# Patient Record
Sex: Female | Born: 1960
Health system: Southern US, Community
[De-identification: ages and names within clinical notes are randomized; demographics above are authoritative.]

## PROBLEM LIST (undated history)

## (undated) DIAGNOSIS — M8580 Other specified disorders of bone density and structure, unspecified site: Secondary | ICD-10-CM

## (undated) DIAGNOSIS — Z5189 Encounter for other specified aftercare: Secondary | ICD-10-CM

## (undated) DIAGNOSIS — E079 Disorder of thyroid, unspecified: Secondary | ICD-10-CM

## (undated) DIAGNOSIS — K219 Gastro-esophageal reflux disease without esophagitis: Secondary | ICD-10-CM

## (undated) HISTORY — PX: ABDOMINAL HYSTERECTOMY: SHX81

## (undated) HISTORY — DX: Gastro-esophageal reflux disease without esophagitis: K21.9

## (undated) HISTORY — PX: COLONOSCOPY: SHX174

## (undated) HISTORY — DX: Encounter for other specified aftercare: Z51.89

## (undated) HISTORY — DX: Other specified disorders of bone density and structure, unspecified site: M85.80

## (undated) HISTORY — PX: BREAST EXCISIONAL BIOPSY: SUR124

---
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
## 1983-06-18 HISTORY — PX: OTHER SURGICAL HISTORY: SHX169

## 1985-06-17 HISTORY — PX: HIP PINNING: SHX1757

## 1987-06-18 HISTORY — PX: OTHER SURGICAL HISTORY: SHX169

## 1989-06-17 HISTORY — PX: CHOLECYSTECTOMY: SHX55

## 1994-06-17 HISTORY — PX: VAGINAL HYSTERECTOMY: SUR661

## 1997-06-17 HISTORY — PX: TUBAL LIGATION: SHX77

## 1997-06-17 HISTORY — PX: APPENDECTOMY: SHX54

## 1997-06-17 HISTORY — PX: OTHER SURGICAL HISTORY: SHX169

## 1998-03-22 ENCOUNTER — Other Ambulatory Visit: Admission: RE | Admit: 1998-03-22 | Discharge: 1998-03-22 | Payer: Self-pay | Admitting: Obstetrics and Gynecology

## 1998-03-25 ENCOUNTER — Observation Stay (HOSPITAL_COMMUNITY): Admission: EM | Admit: 1998-03-25 | Discharge: 1998-03-25 | Payer: Self-pay | Admitting: Emergency Medicine

## 1998-04-04 ENCOUNTER — Ambulatory Visit: Admission: RE | Admit: 1998-04-04 | Discharge: 1998-04-04 | Payer: Self-pay | Admitting: Gynecology

## 1998-04-05 ENCOUNTER — Inpatient Hospital Stay (HOSPITAL_COMMUNITY): Admission: RE | Admit: 1998-04-05 | Discharge: 1998-04-11 | Payer: Self-pay | Admitting: Obstetrics and Gynecology

## 1998-06-02 ENCOUNTER — Ambulatory Visit (HOSPITAL_COMMUNITY): Admission: RE | Admit: 1998-06-02 | Discharge: 1998-06-02 | Payer: Self-pay | Admitting: Surgery

## 1998-06-17 HISTORY — PX: INCISIONAL HERNIA REPAIR: SHX193

## 1999-03-26 ENCOUNTER — Ambulatory Visit (HOSPITAL_COMMUNITY): Admission: RE | Admit: 1999-03-26 | Discharge: 1999-03-26 | Payer: Self-pay | Admitting: Family Medicine

## 1999-03-26 ENCOUNTER — Encounter: Payer: Self-pay | Admitting: Family Medicine

## 1999-08-20 ENCOUNTER — Inpatient Hospital Stay (HOSPITAL_COMMUNITY): Admission: RE | Admit: 1999-08-20 | Discharge: 1999-08-25 | Payer: Self-pay | Admitting: Surgery

## 1999-08-22 ENCOUNTER — Encounter: Payer: Self-pay | Admitting: Surgery

## 1999-10-16 ENCOUNTER — Encounter: Admission: RE | Admit: 1999-10-16 | Discharge: 1999-10-16 | Payer: Self-pay | Admitting: Obstetrics and Gynecology

## 1999-10-16 ENCOUNTER — Encounter: Payer: Self-pay | Admitting: Obstetrics and Gynecology

## 2000-04-08 ENCOUNTER — Encounter: Payer: Self-pay | Admitting: Family Medicine

## 2000-04-08 ENCOUNTER — Encounter: Admission: RE | Admit: 2000-04-08 | Discharge: 2000-04-08 | Payer: Self-pay | Admitting: Family Medicine

## 2000-05-24 ENCOUNTER — Encounter: Payer: Self-pay | Admitting: Internal Medicine

## 2000-05-24 ENCOUNTER — Emergency Department (HOSPITAL_COMMUNITY): Admission: EM | Admit: 2000-05-24 | Discharge: 2000-05-24 | Payer: Self-pay | Admitting: Internal Medicine

## 2002-02-10 ENCOUNTER — Encounter: Payer: Self-pay | Admitting: Obstetrics and Gynecology

## 2002-02-10 ENCOUNTER — Encounter: Admission: RE | Admit: 2002-02-10 | Discharge: 2002-02-10 | Payer: Self-pay | Admitting: Obstetrics and Gynecology

## 2002-04-09 ENCOUNTER — Encounter: Admission: RE | Admit: 2002-04-09 | Discharge: 2002-04-09 | Payer: Self-pay | Admitting: Family Medicine

## 2002-04-09 ENCOUNTER — Encounter: Payer: Self-pay | Admitting: Family Medicine

## 2003-08-24 ENCOUNTER — Other Ambulatory Visit: Admission: RE | Admit: 2003-08-24 | Discharge: 2003-08-24 | Payer: Self-pay | Admitting: Family Medicine

## 2003-10-06 ENCOUNTER — Ambulatory Visit (HOSPITAL_COMMUNITY): Admission: RE | Admit: 2003-10-06 | Discharge: 2003-10-06 | Payer: Self-pay | Admitting: *Deleted

## 2004-08-15 ENCOUNTER — Encounter: Admission: RE | Admit: 2004-08-15 | Discharge: 2004-08-15 | Payer: Self-pay | Admitting: Family Medicine

## 2004-08-28 ENCOUNTER — Encounter: Admission: RE | Admit: 2004-08-28 | Discharge: 2004-08-28 | Payer: Self-pay | Admitting: Family Medicine

## 2005-04-15 ENCOUNTER — Encounter: Admission: RE | Admit: 2005-04-15 | Discharge: 2005-04-15 | Payer: Self-pay | Admitting: Family Medicine

## 2005-09-06 ENCOUNTER — Encounter: Admission: RE | Admit: 2005-09-06 | Discharge: 2005-09-06 | Payer: Self-pay | Admitting: Family Medicine

## 2005-10-16 ENCOUNTER — Encounter: Payer: Self-pay | Admitting: Obstetrics & Gynecology

## 2006-09-23 ENCOUNTER — Encounter: Admission: RE | Admit: 2006-09-23 | Discharge: 2006-09-23 | Payer: Self-pay | Admitting: Family Medicine

## 2008-10-20 ENCOUNTER — Encounter: Admission: RE | Admit: 2008-10-20 | Discharge: 2008-10-20 | Payer: Self-pay | Admitting: Family Medicine

## 2009-11-19 ENCOUNTER — Emergency Department (HOSPITAL_COMMUNITY): Admission: EM | Admit: 2009-11-19 | Discharge: 2009-11-19 | Payer: Self-pay | Admitting: Family Medicine

## 2010-02-01 ENCOUNTER — Encounter: Admission: RE | Admit: 2010-02-01 | Discharge: 2010-02-01 | Payer: Self-pay | Admitting: Nurse Practitioner

## 2010-02-08 ENCOUNTER — Emergency Department (HOSPITAL_COMMUNITY): Admission: EM | Admit: 2010-02-08 | Discharge: 2010-02-08 | Payer: Self-pay | Admitting: Family Medicine

## 2011-02-26 ENCOUNTER — Other Ambulatory Visit: Payer: Self-pay | Admitting: Family Medicine

## 2011-02-26 DIAGNOSIS — Z1231 Encounter for screening mammogram for malignant neoplasm of breast: Secondary | ICD-10-CM

## 2011-03-04 ENCOUNTER — Ambulatory Visit
Admission: RE | Admit: 2011-03-04 | Discharge: 2011-03-04 | Disposition: A | Discharge status: Home / Self Care | Payer: Self-pay | Source: Ambulatory Visit | Attending: Family Medicine | Admitting: Family Medicine

## 2011-03-04 DIAGNOSIS — Z1231 Encounter for screening mammogram for malignant neoplasm of breast: Secondary | ICD-10-CM

## 2011-08-14 ENCOUNTER — Emergency Department (INDEPENDENT_AMBULATORY_CARE_PROVIDER_SITE_OTHER): Payer: 59

## 2011-08-14 ENCOUNTER — Emergency Department (HOSPITAL_COMMUNITY)
Admission: EM | Admit: 2011-08-14 | Discharge: 2011-08-14 | Disposition: A | Discharge status: Home / Self Care | Payer: 59 | Source: Home / Self Care | Attending: Family Medicine | Admitting: Family Medicine

## 2011-08-14 ENCOUNTER — Encounter (HOSPITAL_COMMUNITY): Payer: Self-pay | Admitting: *Deleted

## 2011-08-14 DIAGNOSIS — S5002XA Contusion of left elbow, initial encounter: Secondary | ICD-10-CM

## 2011-08-14 DIAGNOSIS — S5000XA Contusion of unspecified elbow, initial encounter: Secondary | ICD-10-CM

## 2011-08-14 HISTORY — DX: Disorder of thyroid, unspecified: E07.9

## 2011-08-14 NOTE — Discharge Instructions (Signed)
Wear sling for comfort, ice and advil for soreness as needed, activity as tolerated,

## 2011-08-14 NOTE — ED Provider Notes (Signed)
History     CSN: [id number]  Arrival date & time 08/14/11  Ernestina Columbia   First MD Initiated Contact with Patient 08/14/11 1923      Chief Complaint  Patient presents with  . Fall    (Consider location/radiation/quality/duration/timing/severity/associated sxs/prior treatment) Patient is a 51 y.o. female presenting with fall. The history is provided by the patient.  Fall The accident occurred 6 to 12 hours ago. The fall occurred while walking (carrying grchild and stumbled landing on left arm, tried to protect child, c/o elbow pain.). She landed on concrete. The point of impact was the left elbow. The pain is present in the left elbow. The pain is mild. She was ambulatory at the scene. There was no alcohol use involved in the accident. Pertinent negatives include no numbness.    Past Medical History  Diagnosis Date  . Thyroid condition     Past Surgical History  Procedure Date  . Hip pinning   . Abdominal hysterectomy   . Cholecystectomy   . Tonsillectomy     History reviewed. No pertinent family history.  History  Substance Use Topics  . Smoking status: Not on file  . Smokeless tobacco: Not on file  . Alcohol Use:     OB History    Grav Para Term Preterm Abortions TAB SAB Ect Mult Living                  Review of Systems  Constitutional: Negative.   Musculoskeletal: Negative for back pain, joint swelling and gait problem.  Neurological: Negative for numbness.    Allergies  Compazine and Morphine and related  Home Medications   Current Outpatient Rx  Name Route Sig Dispense Refill  . ALPRAZOLAM ER 0.5 MG PO TB24 Oral Take 0.5 mg by mouth every morning.    Marland Kitchen LEVOTHYROXINE SODIUM 50 MCG PO TABS Oral Take 50 mcg by mouth daily.    . VENLAFAXINE HCL 37.5 MG PO TABS Oral Take 37.5 mg by mouth 2 (two) times daily.      BP 137/82  Pulse 89  Temp(Src) 98.6 F (37 C) (Oral)  Resp 16  SpO2 100%  Physical Exam  Nursing note and vitals reviewed. Constitutional:  She is oriented to person, place, and time. She appears well-developed and well-nourished.  HENT:  Head: Normocephalic and atraumatic.  Musculoskeletal:       Arms: Neurological: She is alert and oriented to person, place, and time.  Skin: Skin is warm and dry.    ED Course  Procedures (including critical care time)  Labs Reviewed - No data to display Dg Elbow Complete Left  08/14/2011  *RADIOLOGY REPORT*  Clinical Data: Injury to left elbow.  LEFT ELBOW - COMPLETE 3+ VIEW  Comparison:  None.  Findings:  There is no evidence of fracture, dislocation, or joint effusion.  There is no evidence of arthropathy or other focal bone abnormality.  Soft tissues are unremarkable.  IMPRESSION: Negative.  Original Report Authenticated By: Reola Calkins, M.D.     1. Contusion of left elbow       MDM  X-rays reviewed and report per radiologist.          Barkley Bruns, MD 08/14/11 [phone]

## 2011-08-14 NOTE — ED Notes (Signed)
PT  FELL    TODAY   AND  INJ  HER  L  ELBOW       SHE  ALSO  REPORTS  SOME  PAIN IN THE  L  WRIST  AS  WELL      -  PAIN ON MOVEMENT AND  PALPATION  YET  DISPLAYS  NO  OBVIOUS  DEFORMITY        SHE IS  AWAKE  AS  WELL AS  ALERT AND  ORIENTED

## 2012-03-02 ENCOUNTER — Other Ambulatory Visit: Payer: Self-pay | Admitting: Family Medicine

## 2012-03-02 DIAGNOSIS — Z1231 Encounter for screening mammogram for malignant neoplasm of breast: Secondary | ICD-10-CM

## 2012-03-05 ENCOUNTER — Ambulatory Visit
Admission: RE | Admit: 2012-03-05 | Discharge: 2012-03-05 | Disposition: A | Discharge status: Home / Self Care | Payer: 59 | Source: Ambulatory Visit | Attending: Family Medicine | Admitting: Family Medicine

## 2012-03-05 DIAGNOSIS — Z1231 Encounter for screening mammogram for malignant neoplasm of breast: Secondary | ICD-10-CM

## 2013-03-03 ENCOUNTER — Other Ambulatory Visit: Payer: Self-pay | Admitting: Family Medicine

## 2013-03-03 DIAGNOSIS — Z1231 Encounter for screening mammogram for malignant neoplasm of breast: Secondary | ICD-10-CM

## 2013-03-23 ENCOUNTER — Ambulatory Visit
Admission: RE | Admit: 2013-03-23 | Discharge: 2013-03-23 | Disposition: A | Discharge status: Home / Self Care | Payer: 59 | Source: Ambulatory Visit | Attending: Family Medicine | Admitting: Family Medicine

## 2013-03-23 DIAGNOSIS — Z1231 Encounter for screening mammogram for malignant neoplasm of breast: Secondary | ICD-10-CM

## 2014-04-04 ENCOUNTER — Other Ambulatory Visit: Payer: Self-pay

## 2014-04-04 DIAGNOSIS — Z1239 Encounter for other screening for malignant neoplasm of breast: Secondary | ICD-10-CM

## 2014-04-07 ENCOUNTER — Other Ambulatory Visit: Payer: Self-pay

## 2014-04-07 DIAGNOSIS — Z1231 Encounter for screening mammogram for malignant neoplasm of breast: Secondary | ICD-10-CM

## 2014-04-15 ENCOUNTER — Encounter (INDEPENDENT_AMBULATORY_CARE_PROVIDER_SITE_OTHER): Payer: Self-pay

## 2014-04-15 ENCOUNTER — Ambulatory Visit
Admission: RE | Admit: 2014-04-15 | Discharge: 2014-04-15 | Disposition: A | Discharge status: Home / Self Care | Payer: 59 | Source: Ambulatory Visit

## 2014-04-15 DIAGNOSIS — Z1231 Encounter for screening mammogram for malignant neoplasm of breast: Secondary | ICD-10-CM

## 2015-03-30 ENCOUNTER — Other Ambulatory Visit: Payer: Self-pay

## 2015-03-30 DIAGNOSIS — Z1231 Encounter for screening mammogram for malignant neoplasm of breast: Secondary | ICD-10-CM

## 2015-06-20 ENCOUNTER — Ambulatory Visit
Admission: RE | Admit: 2015-06-20 | Discharge: 2015-06-20 | Disposition: A | Discharge status: Home / Self Care | Payer: 59 | Source: Ambulatory Visit

## 2015-06-20 DIAGNOSIS — Z1231 Encounter for screening mammogram for malignant neoplasm of breast: Secondary | ICD-10-CM | POA: Diagnosis not present

## 2015-06-21 DIAGNOSIS — M9902 Segmental and somatic dysfunction of thoracic region: Secondary | ICD-10-CM | POA: Diagnosis not present

## 2015-06-21 DIAGNOSIS — M9904 Segmental and somatic dysfunction of sacral region: Secondary | ICD-10-CM | POA: Diagnosis not present

## 2015-06-21 DIAGNOSIS — M5137 Other intervertebral disc degeneration, lumbosacral region: Secondary | ICD-10-CM | POA: Diagnosis not present

## 2015-06-21 DIAGNOSIS — M9903 Segmental and somatic dysfunction of lumbar region: Secondary | ICD-10-CM | POA: Diagnosis not present

## 2015-06-21 DIAGNOSIS — M791 Myalgia: Secondary | ICD-10-CM | POA: Diagnosis not present

## 2015-06-23 MED FILL — UNITHROID 50 MCG TABLET: 50 | 90 days supply | Qty: 90 | Fill #1

## 2015-06-23 MED FILL — ALPRAZolam 0.5 MG TABS: 0.5 | 30 days supply | Qty: 30 | Fill #1

## 2015-06-23 MED FILL — HYDROCODON-APAP 5-325: 5-325 | 2 days supply | Qty: 10 | Fill #0

## 2015-06-23 MED FILL — AMOXICILLIN 500 MG CAPSULE: 500 | 7 days supply | Qty: 30 | Fill #0

## 2015-06-29 MED FILL — VENLAFAXINE HCL ER 37.5 MG: 37.5 | 30 days supply | Qty: 30 | Fill #6

## 2015-06-30 DIAGNOSIS — E559 Vitamin D deficiency, unspecified: Secondary | ICD-10-CM | POA: Diagnosis not present

## 2015-06-30 DIAGNOSIS — M8580 Other specified disorders of bone density and structure, unspecified site: Secondary | ICD-10-CM | POA: Diagnosis not present

## 2015-06-30 DIAGNOSIS — F419 Anxiety disorder, unspecified: Secondary | ICD-10-CM | POA: Diagnosis not present

## 2015-06-30 DIAGNOSIS — Z1211 Encounter for screening for malignant neoplasm of colon: Secondary | ICD-10-CM | POA: Diagnosis not present

## 2015-06-30 DIAGNOSIS — F339 Major depressive disorder, recurrent, unspecified: Secondary | ICD-10-CM | POA: Diagnosis not present

## 2015-06-30 DIAGNOSIS — Z131 Encounter for screening for diabetes mellitus: Secondary | ICD-10-CM | POA: Diagnosis not present

## 2015-06-30 DIAGNOSIS — E7800 Pure hypercholesterolemia, unspecified: Secondary | ICD-10-CM | POA: Diagnosis not present

## 2015-06-30 DIAGNOSIS — Z0000 Encounter for general adult medical examination without abnormal findings: Secondary | ICD-10-CM | POA: Diagnosis not present

## 2015-07-04 ENCOUNTER — Encounter: Payer: Self-pay | Admitting: Internal Medicine

## 2015-07-11 MED FILL — BUPROPION HCL SR 150 MG TAB: 150 | 90 days supply | Qty: 90 | Fill #0

## 2015-08-01 MED FILL — VENLAFAXINE HCL ER 37.5 MG: 37.5 | 30 days supply | Qty: 15 | Fill #0

## 2015-08-07 MED FILL — ALPRAZolam 0.5 MG TABS: 0.5 | 30 days supply | Qty: 30 | Fill #2

## 2015-08-14 ENCOUNTER — Ambulatory Visit (AMBULATORY_SURGERY_CENTER): Payer: Self-pay | Admitting: *Deleted

## 2015-08-14 VITALS — Ht 64.0 in | Wt 176.0 lb

## 2015-08-14 DIAGNOSIS — Z1211 Encounter for screening for malignant neoplasm of colon: Secondary | ICD-10-CM

## 2015-08-14 NOTE — Progress Notes (Signed)
No egg or soy allergy known to patient  No issues with past sedation with any surgeries  or procedures, no intubation problems  No diet pills per patient No home 02 use per patient  No blood thinners per patient   Pt has compazine allergy which is a contraindication for suprep. Gave miralax/ gatorade due to this allergy. Hilda Lias PV

## 2015-08-16 DIAGNOSIS — M9904 Segmental and somatic dysfunction of sacral region: Secondary | ICD-10-CM | POA: Diagnosis not present

## 2015-08-16 DIAGNOSIS — M9903 Segmental and somatic dysfunction of lumbar region: Secondary | ICD-10-CM | POA: Diagnosis not present

## 2015-08-16 DIAGNOSIS — M5137 Other intervertebral disc degeneration, lumbosacral region: Secondary | ICD-10-CM | POA: Diagnosis not present

## 2015-08-16 DIAGNOSIS — M9902 Segmental and somatic dysfunction of thoracic region: Secondary | ICD-10-CM | POA: Diagnosis not present

## 2015-08-16 DIAGNOSIS — M791 Myalgia: Secondary | ICD-10-CM | POA: Diagnosis not present

## 2015-08-18 MED FILL — VENLAFAXINE HCL ER 37.5 MG: 37.5 | 90 days supply | Qty: 90 | Fill #0

## 2015-08-28 ENCOUNTER — Encounter: Payer: 59 | Admitting: Internal Medicine

## 2015-08-30 ENCOUNTER — Encounter: Payer: Self-pay | Admitting: Internal Medicine

## 2015-08-30 ENCOUNTER — Ambulatory Visit (AMBULATORY_SURGERY_CENTER): Payer: 59 | Admitting: Internal Medicine

## 2015-08-30 VITALS — BP 138/77 | HR 73 | Temp 100.2°F | Resp 18 | Ht 64.0 in | Wt 176.0 lb

## 2015-08-30 DIAGNOSIS — Z1211 Encounter for screening for malignant neoplasm of colon: Secondary | ICD-10-CM

## 2015-08-30 MED ORDER — SODIUM CHLORIDE 0.9 % IV SOLN: 500.0000 mL | INTRAVENOUS | Status: DC

## 2015-08-30 NOTE — Patient Instructions (Signed)
YOU HAD AN ENDOSCOPIC PROCEDURE TODAY AT THE [facility]:   Refer to the procedure report that was given to you for any specific questions about what was found during the examination.  If the procedure report does not answer your questions, please call your gastroenterologist to clarify.  If you requested that your care partner not be given the details of your procedure findings, then the procedure report has been included in a sealed envelope for you to review at your convenience later.  YOU SHOULD EXPECT: Some feelings of bloating in the abdomen. Passage of more gas than usual.  Walking can help get rid of the air that was put into your GI tract during the procedure and reduce the bloating. If you had a lower endoscopy (such as a colonoscopy or flexible sigmoidoscopy) you may notice spotting of blood in your stool or on the toilet paper. If you underwent a bowel prep for your procedure, you may not have a normal bowel movement for a few days.  Please Note:  You might notice some irritation and congestion in your nose or some drainage.  This is from the oxygen used during your procedure.  There is no need for concern and it should clear up in a day or so.  SYMPTOMS TO REPORT IMMEDIATELY:   Following lower endoscopy (colonoscopy or flexible sigmoidoscopy):  Excessive amounts of blood in the stool  Significant tenderness or worsening of abdominal pains  Swelling of the abdomen that is new, acute  Fever of 100F or higher   For urgent or emergent issues, a gastroenterologist can be reached at any hour by calling [phone].   DIET: Your first meal following the procedure should be a small meal and then it is ok to progress to your normal diet. Heavy or fried foods are harder to digest and may make you feel nauseous or bloated.  Likewise, meals heavy in dairy and vegetables can increase bloating.  Drink plenty of fluids but you should avoid alcoholic beverages for 24  hours.  ACTIVITY:  You should plan to take it easy for the rest of today and you should NOT DRIVE or use heavy machinery until tomorrow (because of the sedation medicines used during the test).    FOLLOW UP: Our staff will call the number listed on your records the next business day following your procedure to check on you and address any questions or concerns that you may have regarding the information given to you following your procedure. If we do not reach you, we will leave a message.  However, if you are feeling well and you are not experiencing any problems, there is no need to return our call.  We will assume that you have returned to your regular daily activities without incident.  If any biopsies were taken you will be contacted by phone or by letter within the next 1-3 weeks.  Please call us at [phone] if you have not heard about the biopsies in 3 weeks.    SIGNATURES/CONFIDENTIALITY: You and/or your care partner have signed paperwork which will be entered into your electronic medical record.  These signatures attest to the fact that that the information above on your After Visit Summary has been reviewed and is understood.  Full responsibility of the confidentiality of this discharge information lies with you and/or your care-partner. 

## 2015-08-30 NOTE — Op Note (Signed)
[facility] Patient Name: Chelsea Curtis Procedure Date: 08/30/2015 9:15 AM MRN: [id number] Endoscopist: Wilhemina Bonito. Marina Goodell , MD Age: 55 Referring MD:  Date of Birth: 1960/07/15 Gender: Female Procedure:                Colonoscopy Indications:              Screening for colorectal malignant neoplasm Medicines:                Monitored Anesthesia Care Procedure:                Pre-Anesthesia Assessment:                           - Prior to the procedure, a History and Physical                            was performed, and patient medications and                            allergies were reviewed. The patient's tolerance of                            previous anesthesia was also reviewed. The risks                            and benefits of the procedure and the sedation                            options and risks were discussed with the patient.                            All questions were answered, and informed consent                            was obtained. Prior Anticoagulants: The patient has                            taken no previous anticoagulant or antiplatelet                            agents. ASA Grade Assessment: II - A patient with                            mild systemic disease. After reviewing the risks                            and benefits, the patient was deemed in                            satisfactory condition to undergo the procedure.                           After obtaining informed consent, the colonoscope  was passed under direct vision. Throughout the                            procedure, the patient's blood pressure, pulse, and                            oxygen saturations were monitored continuously. The                            Model PCF-H190L [id number]) scope was introduced                            through the anus and advanced to the the cecum,                            identified by appendiceal orifice and  ileocecal                            valve. The colonoscopy was performed without                            difficulty. The patient tolerated the procedure                            well. The quality of the bowel preparation was                            excellent. The bowel preparation used was SUPREP.                            The ileocecal valve, appendiceal orifice, and                            rectum were photographed. Scope In: 9:28:31 AM Scope Out: 9:48:27 AM Scope Withdrawal Time: 0 hours 14 minutes 55 seconds  Total Procedure Duration: 0 hours 19 minutes 56 seconds  Findings:      The perianal and digital rectal examinations were normal.      The entire examined colon appeared normal on direct and retroflexion       views. Complications:            No immediate complications. Estimated Blood Loss:     Estimated blood loss: none. Impression:               - The entire examined colon is normal on direct and                            retroflexion views.                           - No specimens collected. Recommendation:           - Patient has a contact number available for                            emergencies. The signs and symptoms  of potential                            delayed complications were discussed with the                            patient. Return to normal activities tomorrow.                            Written discharge instructions were provided to the                            patient.                           - Resume previous diet.                           - Continue present medications.                           - Repeat colonoscopy in 10 years for screening                            purposes. Procedure Code(s):        --- Professional ---                           [id number], Colorectal cancer screening; colonoscopy on                            individual not meeting criteria for high risk CPT copyright 2016 [employer]. All rights  reserved. Wilhemina BonitoJohn N. Marina GoodellPerry, MD Wilhemina BonitoJohn N. Marina GoodellPerry, MD 08/30/2015 9:53:48 AM This report has been signed electronically. Number of Addenda: 0

## 2015-08-30 NOTE — Progress Notes (Signed)
A/ox3 pleased with MAC, report to Karen RN 

## 2015-08-31 ENCOUNTER — Telehealth: Payer: Self-pay | Admitting: *Deleted

## 2015-08-31 NOTE — Telephone Encounter (Signed)
  Follow up Call-  Call back number 08/30/2015  Post procedure Call Back phone  # [phone]  Permission to leave phone message Yes     Patient questions:  Do you have a fever, pain , or abdominal swelling? no Pain Score  0 *  Have you tolerated food without any problems? Yes.    Have you been able to return to your normal activities? Yes.    Do you have any questions about your discharge instructions: Diet   No. Medications  No. Follow up visit  No.  Do you have questions or concerns about your Care? No.  Actions: * If pain score is 4 or above:  No action needed, pain <4.

## 2015-09-07 DIAGNOSIS — M859 Disorder of bone density and structure, unspecified: Secondary | ICD-10-CM | POA: Diagnosis not present

## 2015-09-07 DIAGNOSIS — M810 Age-related osteoporosis without current pathological fracture: Secondary | ICD-10-CM | POA: Diagnosis not present

## 2015-09-21 MED FILL — UNITHROID 50 MCG TABLET: 50 | 90 days supply | Qty: 90 | Fill #2

## 2015-10-19 MED FILL — BUPROPION HCL SR 150 MG TAB: 150 | 90 days supply | Qty: 90 | Fill #1

## 2015-11-14 MED FILL — VENLAFAXINE HCL ER 37.5 MG: 37.5 | 90 days supply | Qty: 90 | Fill #1

## 2015-12-18 MED FILL — UNITHROID 50 MCG TABLET: 50 | 90 days supply | Qty: 90 | Fill #3

## 2015-12-27 DIAGNOSIS — E039 Hypothyroidism, unspecified: Secondary | ICD-10-CM | POA: Diagnosis not present

## 2015-12-27 DIAGNOSIS — E559 Vitamin D deficiency, unspecified: Secondary | ICD-10-CM | POA: Diagnosis not present

## 2015-12-27 DIAGNOSIS — F339 Major depressive disorder, recurrent, unspecified: Secondary | ICD-10-CM | POA: Diagnosis not present

## 2015-12-27 DIAGNOSIS — F419 Anxiety disorder, unspecified: Secondary | ICD-10-CM | POA: Diagnosis not present

## 2015-12-27 MED FILL — BUPROPION HCL SR 150 MG TAB: 150 | 90 days supply | Qty: 180 | Fill #0

## 2016-01-02 MED FILL — ALPRAZolam 0.5 MG TABS: 0.5 | 30 days supply | Qty: 30 | Fill #0

## 2016-02-23 MED FILL — VENLAFAXINE HCL ER 37.5 MG: 37.5 | 90 days supply | Qty: 90 | Fill #0

## 2016-03-19 MED FILL — UNITHROID 50 MCG TABLET: 50 | 90 days supply | Qty: 90 | Fill #0

## 2016-04-15 MED FILL — ALPRAZolam 0.5 MG TABS: 0.5 | 30 days supply | Qty: 30 | Fill #1

## 2016-04-15 MED FILL — BUPROPION SR 150 MG TABLET: 150 | 90 days supply | Qty: 180 | Fill #1

## 2016-05-06 MED FILL — HYDROCODON-APAP 5-325: 5-325 | 2 days supply | Qty: 10 | Fill #0

## 2016-05-31 MED FILL — VENLAFAXINE HCL ER 37.5 MG: 37.5 | 90 days supply | Qty: 90 | Fill #1

## 2016-05-31 MED FILL — ALPRAZolam 0.5 MG TABS: 0.5 | 30 days supply | Qty: 30 | Fill #2

## 2016-06-13 MED FILL — UNITHROID 50 MCG TABLET: 50 | 90 days supply | Qty: 90 | Fill #1

## 2016-07-09 DIAGNOSIS — M810 Age-related osteoporosis without current pathological fracture: Secondary | ICD-10-CM | POA: Diagnosis not present

## 2016-07-09 DIAGNOSIS — Z131 Encounter for screening for diabetes mellitus: Secondary | ICD-10-CM | POA: Diagnosis not present

## 2016-07-09 DIAGNOSIS — Z0000 Encounter for general adult medical examination without abnormal findings: Secondary | ICD-10-CM | POA: Diagnosis not present

## 2016-07-09 DIAGNOSIS — F339 Major depressive disorder, recurrent, unspecified: Secondary | ICD-10-CM | POA: Diagnosis not present

## 2016-07-09 DIAGNOSIS — K219 Gastro-esophageal reflux disease without esophagitis: Secondary | ICD-10-CM | POA: Diagnosis not present

## 2016-07-09 DIAGNOSIS — E7800 Pure hypercholesterolemia, unspecified: Secondary | ICD-10-CM | POA: Diagnosis not present

## 2016-07-09 DIAGNOSIS — E039 Hypothyroidism, unspecified: Secondary | ICD-10-CM | POA: Diagnosis not present

## 2016-07-09 DIAGNOSIS — F419 Anxiety disorder, unspecified: Secondary | ICD-10-CM | POA: Diagnosis not present

## 2016-07-12 DIAGNOSIS — H04123 Dry eye syndrome of bilateral lacrimal glands: Secondary | ICD-10-CM | POA: Diagnosis not present

## 2016-07-12 MED FILL — LOTEMAX 0.5% GEL: 0.5 | 25 days supply | Qty: 5 | Fill #0

## 2016-09-12 MED FILL — UNITHROID 50 MCG TABLET: 50 | 90 days supply | Qty: 90 | Fill #0

## 2016-09-12 MED FILL — BUPROPION SR 150 MG TABLET: 150 | 90 days supply | Qty: 180 | Fill #2

## 2016-09-12 MED FILL — VENLAFAXINE HCL ER 37.5 MG: 37.5 | 90 days supply | Qty: 90 | Fill #0

## 2016-09-13 MED FILL — ALPRAZolam 0.5 MG TABS: 0.5 | 30 days supply | Qty: 30 | Fill #0

## 2016-12-13 MED FILL — UNITHROID 50 MCG TABLET: 50 | 90 days supply | Qty: 90 | Fill #1

## 2016-12-13 MED FILL — VENLAFAXINE HCL ER 37.5 MG: 37.5 | 90 days supply | Qty: 90 | Fill #1

## 2016-12-16 DIAGNOSIS — H00021 Hordeolum internum right upper eyelid: Secondary | ICD-10-CM | POA: Diagnosis not present

## 2016-12-16 MED FILL — CEPHALEXIN 500 MG CAPSULE: 500 | 5 days supply | Qty: 20 | Fill #0

## 2017-01-01 DIAGNOSIS — E039 Hypothyroidism, unspecified: Secondary | ICD-10-CM | POA: Diagnosis not present

## 2017-01-01 DIAGNOSIS — F419 Anxiety disorder, unspecified: Secondary | ICD-10-CM | POA: Diagnosis not present

## 2017-01-01 DIAGNOSIS — F339 Major depressive disorder, recurrent, unspecified: Secondary | ICD-10-CM | POA: Diagnosis not present

## 2017-01-01 MED FILL — ALPRAZolam 0.5 MG TABS: 0.5 | 30 days supply | Qty: 30 | Fill #0

## 2017-01-01 MED FILL — BUPROPION SR 150 MG TABLET: 150 | 90 days supply | Qty: 180 | Fill #0

## 2017-01-21 DIAGNOSIS — H18453 Nodular corneal degeneration, bilateral: Secondary | ICD-10-CM | POA: Diagnosis not present

## 2017-01-21 DIAGNOSIS — H35361 Drusen (degenerative) of macula, right eye: Secondary | ICD-10-CM | POA: Diagnosis not present

## 2017-01-21 DIAGNOSIS — H524 Presbyopia: Secondary | ICD-10-CM | POA: Diagnosis not present

## 2017-03-11 MED FILL — UNITHROID 50 MCG TABLET: 50 | 90 days supply | Qty: 90 | Fill #2

## 2017-03-12 MED FILL — VENLAFAXINE HCL ER 37.5 MG: 37.5 | 90 days supply | Qty: 90 | Fill #0

## 2017-04-18 ENCOUNTER — Other Ambulatory Visit: Payer: Self-pay | Admitting: Internal Medicine

## 2017-04-18 DIAGNOSIS — Z1231 Encounter for screening mammogram for malignant neoplasm of breast: Secondary | ICD-10-CM

## 2017-05-16 ENCOUNTER — Ambulatory Visit
Admission: RE | Admit: 2017-05-16 | Discharge: 2017-05-16 | Disposition: A | Discharge status: Home / Self Care | Payer: 59 | Source: Ambulatory Visit | Attending: Internal Medicine | Admitting: Internal Medicine

## 2017-05-16 DIAGNOSIS — Z1231 Encounter for screening mammogram for malignant neoplasm of breast: Secondary | ICD-10-CM | POA: Diagnosis not present

## 2017-06-03 MED FILL — VENLAFAXINE HCL ER 37.5 MG: 37.5 | 90 days supply | Qty: 90 | Fill #1

## 2017-06-03 MED FILL — UNITHROID 50 MCG TABLET: 50 | 90 days supply | Qty: 90 | Fill #3

## 2017-06-03 MED FILL — ALPRAZolam 0.5 MG TABS: 0.5 | 30 days supply | Qty: 30 | Fill #1

## 2017-09-01 MED FILL — UNITHROID 50 MCG TABLET: 50 | 90 days supply | Qty: 90 | Fill #0

## 2017-09-10 MED FILL — VENLAFAXINE HCL ER 37.5 MG: 37.5 | 90 days supply | Qty: 90 | Fill #0

## 2017-11-07 DIAGNOSIS — M810 Age-related osteoporosis without current pathological fracture: Secondary | ICD-10-CM | POA: Diagnosis not present

## 2017-11-07 DIAGNOSIS — F339 Major depressive disorder, recurrent, unspecified: Secondary | ICD-10-CM | POA: Diagnosis not present

## 2017-11-07 DIAGNOSIS — W57XXXA Bitten or stung by nonvenomous insect and other nonvenomous arthropods, initial encounter: Secondary | ICD-10-CM | POA: Diagnosis not present

## 2017-11-07 DIAGNOSIS — E7800 Pure hypercholesterolemia, unspecified: Secondary | ICD-10-CM | POA: Diagnosis not present

## 2017-11-07 DIAGNOSIS — E559 Vitamin D deficiency, unspecified: Secondary | ICD-10-CM | POA: Diagnosis not present

## 2017-11-07 DIAGNOSIS — Z0000 Encounter for general adult medical examination without abnormal findings: Secondary | ICD-10-CM | POA: Diagnosis not present

## 2017-11-07 DIAGNOSIS — S70361A Insect bite (nonvenomous), right thigh, initial encounter: Secondary | ICD-10-CM | POA: Diagnosis not present

## 2017-11-07 DIAGNOSIS — E039 Hypothyroidism, unspecified: Secondary | ICD-10-CM | POA: Diagnosis not present

## 2017-11-07 DIAGNOSIS — Z131 Encounter for screening for diabetes mellitus: Secondary | ICD-10-CM | POA: Diagnosis not present

## 2017-11-07 MED FILL — ALPRAZolam 0.5 MG TABS: 0.5 | 30 days supply | Qty: 30 | Fill #0

## 2017-11-07 MED FILL — DOXYCYCLINE HYCLATE 100 MG: 100 | 1 days supply | Qty: 2 | Fill #0

## 2017-11-12 MED FILL — UNITHROID 75 MCG TABS: 75 | 90 days supply | Qty: 90 | Fill #0

## 2017-12-11 MED FILL — VENLAFAXINE HCL ER 37.5 MG: 37.5 | 90 days supply | Qty: 90 | Fill #0

## 2018-01-12 DIAGNOSIS — M810 Age-related osteoporosis without current pathological fracture: Secondary | ICD-10-CM | POA: Diagnosis not present

## 2018-02-18 MED FILL — ALPRAZolam 0.5 MG TABS: 0.5 | 30 days supply | Qty: 30 | Fill #1

## 2018-02-18 MED FILL — UNITHROID 75 MCG TABS: 75 | 90 days supply | Qty: 90 | Fill #1

## 2018-03-12 MED FILL — VENLAFAXINE HCL ER 37.5 MG: 37.5 | 90 days supply | Qty: 90 | Fill #0

## 2018-04-21 ENCOUNTER — Other Ambulatory Visit: Payer: Self-pay | Admitting: Internal Medicine

## 2018-04-21 DIAGNOSIS — Z1231 Encounter for screening mammogram for malignant neoplasm of breast: Secondary | ICD-10-CM

## 2018-05-13 MED FILL — UNITHROID 75 MCG TABS: 75 | 90 days supply | Qty: 90 | Fill #2

## 2018-06-03 ENCOUNTER — Ambulatory Visit
Admission: RE | Admit: 2018-06-03 | Discharge: 2018-06-03 | Disposition: A | Discharge status: Home / Self Care | Payer: 59 | Source: Ambulatory Visit | Attending: Internal Medicine | Admitting: Internal Medicine

## 2018-06-03 DIAGNOSIS — Z1231 Encounter for screening mammogram for malignant neoplasm of breast: Secondary | ICD-10-CM | POA: Diagnosis not present

## 2018-06-18 MED FILL — ALPRAZolam 0.5 MG TABS: 0.5 | 30 days supply | Qty: 30 | Fill #0

## 2018-06-18 MED FILL — VENLAFAXINE HCL ER 37.5 MG: 37.5 | 90 days supply | Qty: 90 | Fill #0

## 2018-08-13 MED FILL — ALPRAZolam 0.5 MG TABS: 0.5 | 30 days supply | Qty: 30 | Fill #1

## 2018-08-13 MED FILL — UNITHROID 75 MCG TABS: 75 | 30 days supply | Qty: 30 | Fill #0

## 2018-08-31 DIAGNOSIS — E039 Hypothyroidism, unspecified: Secondary | ICD-10-CM | POA: Diagnosis not present

## 2018-09-08 MED FILL — UNITHROID 50 MCG TABLET: 50 | 90 days supply | Qty: 90 | Fill #0

## 2018-09-08 MED FILL — VENLAFAXINE HCL ER 37.5 MG: 37.5 | 90 days supply | Qty: 90 | Fill #0

## 2018-09-12 MED FILL — ALPRAZolam 0.5 MG TABS: 0.5 | 30 days supply | Qty: 30 | Fill #2

## 2018-11-02 MED FILL — UNITHROID 75 MCG TABS: 75 | 30 days supply | Qty: 30 | Fill #0

## 2018-12-02 MED FILL — UNITHROID 75 MCG TABS: 75 | 30 days supply | Qty: 30 | Fill #1

## 2018-12-28 MED FILL — VENLAFAXINE HCL ER 37.5 MG: 37.5 | 90 days supply | Qty: 90 | Fill #0

## 2018-12-28 MED FILL — UNITHROID 75 MCG TABS: 75 | 30 days supply | Qty: 30 | Fill #2

## 2018-12-28 MED FILL — ALPRAZOLAM 0.5 MG TABS: 0.5 | 30 days supply | Qty: 30 | Fill #0

## 2019-01-11 DIAGNOSIS — F4321 Adjustment disorder with depressed mood: Secondary | ICD-10-CM | POA: Diagnosis not present

## 2019-01-11 DIAGNOSIS — F419 Anxiety disorder, unspecified: Secondary | ICD-10-CM | POA: Diagnosis not present

## 2019-01-11 MED FILL — ALPRAZOLAM 0.5 MG TABS: 0.5 | 30 days supply | Qty: 90 | Fill #0

## 2019-01-26 DIAGNOSIS — S0001XA Abrasion of scalp, initial encounter: Secondary | ICD-10-CM | POA: Diagnosis not present

## 2019-01-27 MED FILL — UNITHROID 75 MCG TABS: 75 | 30 days supply | Qty: 30 | Fill #3

## 2019-02-08 DIAGNOSIS — F419 Anxiety disorder, unspecified: Secondary | ICD-10-CM | POA: Diagnosis not present

## 2019-02-10 DIAGNOSIS — H524 Presbyopia: Secondary | ICD-10-CM | POA: Diagnosis not present

## 2019-02-10 DIAGNOSIS — H35362 Drusen (degenerative) of macula, left eye: Secondary | ICD-10-CM | POA: Diagnosis not present

## 2019-02-10 DIAGNOSIS — H35361 Drusen (degenerative) of macula, right eye: Secondary | ICD-10-CM | POA: Diagnosis not present

## 2019-02-10 DIAGNOSIS — H18453 Nodular corneal degeneration, bilateral: Secondary | ICD-10-CM | POA: Diagnosis not present

## 2019-02-15 MED FILL — HYDROCODON-APAP 5-325: 5-325 | 2 days supply | Qty: 10 | Fill #0

## 2019-02-25 MED FILL — UNITHROID 75 MCG TABS: 75 | 30 days supply | Qty: 30 | Fill #0

## 2019-02-25 MED FILL — ALPRAZOLAM 0.5 MG TABS: 0.5 | 30 days supply | Qty: 90 | Fill #1

## 2019-03-11 DIAGNOSIS — Z13220 Encounter for screening for lipoid disorders: Secondary | ICD-10-CM | POA: Diagnosis not present

## 2019-03-11 DIAGNOSIS — E559 Vitamin D deficiency, unspecified: Secondary | ICD-10-CM | POA: Diagnosis not present

## 2019-03-11 DIAGNOSIS — Z0000 Encounter for general adult medical examination without abnormal findings: Secondary | ICD-10-CM | POA: Diagnosis not present

## 2019-03-11 DIAGNOSIS — M8580 Other specified disorders of bone density and structure, unspecified site: Secondary | ICD-10-CM | POA: Diagnosis not present

## 2019-03-11 DIAGNOSIS — F419 Anxiety disorder, unspecified: Secondary | ICD-10-CM | POA: Diagnosis not present

## 2019-03-11 DIAGNOSIS — E039 Hypothyroidism, unspecified: Secondary | ICD-10-CM | POA: Diagnosis not present

## 2019-03-25 MED FILL — VENLAFAXINE HCL ER 37.5 MG: 37.5 | 90 days supply | Qty: 90 | Fill #1

## 2019-03-25 MED FILL — UNITHROID 75 MCG TABS: 75 | 30 days supply | Qty: 30 | Fill #1

## 2019-04-01 DIAGNOSIS — F419 Anxiety disorder, unspecified: Secondary | ICD-10-CM | POA: Diagnosis not present

## 2019-04-01 DIAGNOSIS — I10 Essential (primary) hypertension: Secondary | ICD-10-CM | POA: Diagnosis not present

## 2019-04-01 MED FILL — LISINOPRIL 10 MG TABS: 10 | 30 days supply | Qty: 30 | Fill #0

## 2019-04-01 MED FILL — ALPRAZolam 0.5 MG TABS: 0.5 | 30 days supply | Qty: 90 | Fill #2

## 2019-04-07 DIAGNOSIS — I4510 Unspecified right bundle-branch block: Secondary | ICD-10-CM | POA: Diagnosis not present

## 2019-04-07 DIAGNOSIS — F419 Anxiety disorder, unspecified: Secondary | ICD-10-CM | POA: Diagnosis not present

## 2019-04-22 MED FILL — UNITHROID 75 MCG TABS: 75 | 30 days supply | Qty: 30 | Fill #2

## 2019-04-28 ENCOUNTER — Other Ambulatory Visit: Payer: Self-pay | Admitting: Internal Medicine

## 2019-04-28 DIAGNOSIS — Z1231 Encounter for screening mammogram for malignant neoplasm of breast: Secondary | ICD-10-CM

## 2019-04-29 MED FILL — ALPRAZolam 0.5 MG TABS: 0.5 | 30 days supply | Qty: 90 | Fill #3

## 2019-04-29 MED FILL — LISINOPRIL 10 MG TABS: 10 | 30 days supply | Qty: 30 | Fill #1

## 2019-05-20 MED FILL — UNITHROID 75 MCG TABS: 75 | 30 days supply | Qty: 30 | Fill #0

## 2019-06-01 MED FILL — LISINOPRIL 10 MG TABS: 10 | 30 days supply | Qty: 30 | Fill #2

## 2019-06-16 MED FILL — UNITHROID 75 MCG TABS: 75 | 30 days supply | Qty: 30 | Fill #1

## 2019-06-23 ENCOUNTER — Ambulatory Visit
Admission: RE | Admit: 2019-06-23 | Discharge: 2019-06-23 | Disposition: A | Discharge status: Home / Self Care | Payer: 59 | Source: Ambulatory Visit | Attending: Internal Medicine | Admitting: Internal Medicine

## 2019-06-23 ENCOUNTER — Other Ambulatory Visit: Payer: Self-pay

## 2019-06-23 DIAGNOSIS — Z1231 Encounter for screening mammogram for malignant neoplasm of breast: Secondary | ICD-10-CM

## 2019-06-24 MED FILL — VENLAFAXINE HCL ER 37.5 MG: 37.5 | 90 days supply | Qty: 90 | Fill #0

## 2019-06-24 MED FILL — ALPRAZolam 0.5 MG TABS: 0.5 | 30 days supply | Qty: 90 | Fill #0

## 2019-06-25 MED FILL — LISINOPRIL 10 MG TABS: 10 | 90 days supply | Qty: 90 | Fill #3

## 2019-07-13 MED FILL — UNITHROID 75 MCG TABS: 75 | 30 days supply | Qty: 30 | Fill #2

## 2019-08-12 DIAGNOSIS — F339 Major depressive disorder, recurrent, unspecified: Secondary | ICD-10-CM | POA: Diagnosis not present

## 2019-08-12 DIAGNOSIS — F419 Anxiety disorder, unspecified: Secondary | ICD-10-CM | POA: Diagnosis not present

## 2019-08-12 DIAGNOSIS — E039 Hypothyroidism, unspecified: Secondary | ICD-10-CM | POA: Diagnosis not present

## 2019-08-12 MED FILL — UNITHROID 75 MCG TABS: 75 | 90 days supply | Qty: 90 | Fill #0

## 2019-08-12 MED FILL — ALPRAZolam 0.5 MG TABS: 0.5 | 30 days supply | Qty: 90 | Fill #0

## 2019-10-01 MED FILL — LISINOPRIL 10 MG TABS: 10 | 90 days supply | Qty: 90 | Fill #0

## 2020-01-04 MED FILL — ALPRAZolam 0.5 MG TABS: 0.5 | 30 days supply | Qty: 90 | Fill #2

## 2020-01-04 MED FILL — LISINOPRIL 10 MG TABS: 10 | 90 days supply | Qty: 90 | Fill #1

## 2020-01-06 MED FILL — VENLAFAXINE HCL ER 37.5 MG: 37.5 | 90 days supply | Qty: 90 | Fill #1

## 2020-01-14 MED FILL — VENLAFAXINE HCL ER 37.5 MG: 37.5 | 90 days supply | Qty: 90 | Fill #1

## 2020-02-11 MED FILL — UNITHROID 75 MCG TABS: 75 | 90 days supply | Qty: 90 | Fill #0

## 2020-03-24 DIAGNOSIS — Z0000 Encounter for general adult medical examination without abnormal findings: Secondary | ICD-10-CM | POA: Diagnosis not present

## 2020-03-24 DIAGNOSIS — E039 Hypothyroidism, unspecified: Secondary | ICD-10-CM | POA: Diagnosis not present

## 2020-03-24 DIAGNOSIS — E559 Vitamin D deficiency, unspecified: Secondary | ICD-10-CM | POA: Diagnosis not present

## 2020-03-24 DIAGNOSIS — F339 Major depressive disorder, recurrent, unspecified: Secondary | ICD-10-CM | POA: Diagnosis not present

## 2020-03-24 DIAGNOSIS — Z131 Encounter for screening for diabetes mellitus: Secondary | ICD-10-CM | POA: Diagnosis not present

## 2020-03-24 DIAGNOSIS — I10 Essential (primary) hypertension: Secondary | ICD-10-CM | POA: Diagnosis not present

## 2020-03-24 DIAGNOSIS — F419 Anxiety disorder, unspecified: Secondary | ICD-10-CM | POA: Diagnosis not present

## 2020-03-24 DIAGNOSIS — E7800 Pure hypercholesterolemia, unspecified: Secondary | ICD-10-CM | POA: Diagnosis not present

## 2020-04-10 ENCOUNTER — Other Ambulatory Visit (HOSPITAL_BASED_OUTPATIENT_CLINIC_OR_DEPARTMENT_OTHER): Payer: Self-pay | Admitting: Nurse Practitioner

## 2020-04-10 MED FILL — LISINOPRIL 10 MG TABS: 10 | 90 days supply | Qty: 90 | Fill #0

## 2020-04-11 ENCOUNTER — Other Ambulatory Visit (HOSPITAL_BASED_OUTPATIENT_CLINIC_OR_DEPARTMENT_OTHER): Payer: Self-pay | Admitting: Nurse Practitioner

## 2020-04-11 MED FILL — VENLAFAXINE HCL ER 37.5 MG: 37.5 | 90 days supply | Qty: 90 | Fill #0

## 2020-05-03 ENCOUNTER — Other Ambulatory Visit (HOSPITAL_BASED_OUTPATIENT_CLINIC_OR_DEPARTMENT_OTHER): Payer: Self-pay | Admitting: Nurse Practitioner

## 2020-05-03 MED FILL — UNITHROID 75 MCG TABS: 75 | 90 days supply | Qty: 90 | Fill #0

## 2020-05-25 DIAGNOSIS — H524 Presbyopia: Secondary | ICD-10-CM | POA: Diagnosis not present

## 2020-05-25 DIAGNOSIS — H35361 Drusen (degenerative) of macula, right eye: Secondary | ICD-10-CM | POA: Diagnosis not present

## 2020-05-25 DIAGNOSIS — H18453 Nodular corneal degeneration, bilateral: Secondary | ICD-10-CM | POA: Diagnosis not present

## 2020-05-25 DIAGNOSIS — H35362 Drusen (degenerative) of macula, left eye: Secondary | ICD-10-CM | POA: Diagnosis not present

## 2020-06-05 ENCOUNTER — Other Ambulatory Visit (HOSPITAL_BASED_OUTPATIENT_CLINIC_OR_DEPARTMENT_OTHER): Payer: Self-pay | Admitting: Nurse Practitioner

## 2020-06-06 MED FILL — ALPRAZolam 0.5 MG TABS: 0.5 | 30 days supply | Qty: 90 | Fill #0

## 2020-07-18 MED FILL — UNITHROID 75 MCG TABS: 75 | 90 days supply | Qty: 90 | Fill #1

## 2020-07-18 MED FILL — LISINOPRIL 10 MG TABS: 10 | 90 days supply | Qty: 90 | Fill #1

## 2020-07-18 MED FILL — VENLAFAXINE HCL ER 37.5 MG: 37.5 | 90 days supply | Qty: 90 | Fill #1

## 2020-07-19 ENCOUNTER — Other Ambulatory Visit: Payer: Self-pay | Admitting: Nurse Practitioner

## 2020-07-19 DIAGNOSIS — Z1231 Encounter for screening mammogram for malignant neoplasm of breast: Secondary | ICD-10-CM

## 2020-08-24 MED FILL — ALPRAZolam 0.5 MG TABS: 0.5 | 30 days supply | Qty: 90 | Fill #1

## 2020-09-04 ENCOUNTER — Ambulatory Visit
Admission: RE | Admit: 2020-09-04 | Discharge: 2020-09-04 | Disposition: A | Discharge status: Home / Self Care | Payer: 59 | Source: Ambulatory Visit | Attending: Nurse Practitioner | Admitting: Nurse Practitioner

## 2020-09-04 ENCOUNTER — Other Ambulatory Visit: Payer: Self-pay

## 2020-09-04 DIAGNOSIS — Z1231 Encounter for screening mammogram for malignant neoplasm of breast: Secondary | ICD-10-CM

## 2020-10-06 ENCOUNTER — Telehealth: Payer: 59 | Admitting: Physician Assistant

## 2020-10-06 ENCOUNTER — Other Ambulatory Visit (HOSPITAL_BASED_OUTPATIENT_CLINIC_OR_DEPARTMENT_OTHER): Payer: Self-pay

## 2020-10-06 DIAGNOSIS — S70369A Insect bite (nonvenomous), unspecified thigh, initial encounter: Secondary | ICD-10-CM

## 2020-10-06 DIAGNOSIS — W57XXXA Bitten or stung by nonvenomous insect and other nonvenomous arthropods, initial encounter: Secondary | ICD-10-CM | POA: Diagnosis not present

## 2020-10-06 MED ORDER — DOXYCYCLINE HYCLATE 100 MG PO TABS
200.0000 mg | ORAL_TABLET | Freq: Once | ORAL | 0 refills | Status: AC
  Filled 2020-10-06: qty 2, 1d supply, fill #0

## 2020-10-06 NOTE — Progress Notes (Signed)
Thank you for describing your tick bite, Here is how we plan to help! Based on the information that you shared with me it looks like you have A tick that bite that we will treat with a short course of doxycycline.  In most cases a tick bite is painless and does not itch.  Most tick bites in which the tick is quickly removed do not require prescriptions. Ticks can transmit several diseases if they are infected and remain attacked to your skin. Therefore the length that the tick was attached and any symptoms you have experienced after the bite are import to accurately develop your custom treatment plan. In most cases a single dose of doxycycline may prevent the development of a more serious condition.  Based on your information I have Provided a home care guide for tick bites  and  instructions on when to call for help. and I have sent a single dose of doxycycline to the pharmacy you selected. Please make sure that you selected a pharmacy that is open now.  Which ticks  are associated with illness?  The Wood Tick (dog tick) is the size of a watermelon seed and can sometimes transmit Rocky Mountain spotted fever and [place] tick fever.   The Deer Tick (black-legged tick) is between the size of a poppy seed (pin head) and an apple seed, and can sometimes transmit Lyme disease.  A brown to black tick with a white splotch on its back is likely a female Amblyomma americanum (Lone Star tick). This tick has been associated with Southern Tick Associated illness ( STARI)  Lyme disease has become the most common tick-borne illness in the [place]. The risk of Lyme disease following a recognized deer tick bite is estimated to be 1%.  The majority of cases of Lyme disease start with a bull's eye rash at the site of the tick bite. The rash can occur days to weeks (typically 7-10 days) after a tick bite. Treatment with antibiotics is indicated if this rash appears. Flu-like symptoms may accompany the rash,  including: fever, chills, headaches, muscle aches, and fatigue. Removing ticks promptly may prevent tick borne disease.  What can be used to prevent Tick Bites?   Insect repellant with at leas 20% DEET.  Wearing long pants with sock and shoes.  Avoiding tall grass and heavily wooded areas.  Checking your skin after being outdoors.  Shower with a washcloth after outdoor exposures.  HOME CARE ADVICE FOR TICK BITE  1. Wood Tick Removal:  o Use a pair of tweezers and grasp the wood tick close to the skin (on its head). Pull the wood tick straight upward without twisting or crushing it. Maintain a steady pressure until it releases its grip.   o If tweezers aren't available, use fingers, a loop of thread around the jaws, or a needle between the jaws for traction.  o Note: covering the tick with petroleum jelly, nail polish or rubbing alcohol doesn't work. Neither does touching the tick with a hot or cold object. 2. Tiny Deer Tick Removal:   o Needs to be scraped off with a knife blade or credit card edge. o Place tick in a sealed container (e.g. glass jar, zip lock plastic bag), in case your doctor wants to see it. 3. Tick's Head Removal:  o If the wood tick's head breaks off in the skin, it must be removed. Clean the skin. Then use a sterile needle to uncover the head and lift it out   or scrape it off.  o If a very small piece of the head remains, the skin will eventually slough it off. 4. Antibiotic Ointment:  o Wash the wound and your hands with soap and water after removal to prevent catching any tick disease.  Apply an over the counter antibiotic ointment (e.g. bacitracin) to the bite once. 5. Expected Course: Tick bites normally don't itch or hurt. That's why they often go unnoticed. 6. Call Your Doctor If:  o You can't remove the tick or the tick's head o Fever, a severe head ache, or rash occur in the next 2 weeks o Bite begins to look infected o Lyme's disease is common in your  area o You have not had a tetanus in the last 10 years o Your current symptoms become worse    MAKE SURE YOU   Understand these instructions.  Will watch your condition.  Will get help right away if you are not doing well or get worse.   Thank you for choosing an e-visit.  Your e-visit answers were reviewed by a board certified advanced clinical practitioner to complete your personal care plan. Depending upon the condition, your plan could have included both over the counter or prescription medications. Please review your pharmacy choice. If there is a problem you may use MyChart messaging to have the prescription routed to another pharmacy. Your safety is important to us. If you have drug allergies check your prescription carefully.   You can use MyChart to ask questions about today's visit, request a non-urgent call back, or ask for a work or school excuse for 24 hours related to this e-Visit. If it has been greater than 24 hours you will need to follow up with your provider, or enter a new e-Visit to address those concerns.  You will get an email in the next two days asking about your experience. I hope  that your e-visit has been valuable and will speed your recovery 

## 2020-10-06 NOTE — Progress Notes (Signed)
I have spent 5 minutes in review of e-visit questionnaire, review and updating patient chart, medical decision making and response to patient.   Viriginia Amendola Cody Kj Imbert, PA-C    

## 2020-10-16 ENCOUNTER — Other Ambulatory Visit (HOSPITAL_BASED_OUTPATIENT_CLINIC_OR_DEPARTMENT_OTHER): Payer: Self-pay

## 2020-10-16 MED ORDER — VENLAFAXINE HCL ER 37.5 MG PO CP24
ORAL_CAPSULE | ORAL | 2 refills | Status: DC
  Filled 2020-10-16: qty 90, 90d supply, fill #0
  Filled 2021-01-10: qty 90, 90d supply, fill #1

## 2020-10-16 MED FILL — Lisinopril Tab 10 MG: ORAL | 90 days supply | Qty: 90 | Fill #0 | Status: AC

## 2020-10-16 MED FILL — Levothyroxine Sodium Tab 75 MCG: ORAL | 90 days supply | Qty: 90 | Fill #0 | Status: AC

## 2020-10-16 MED FILL — Alprazolam Tab 0.5 MG: ORAL | 30 days supply | Qty: 90 | Fill #0 | Status: AC

## 2020-10-16 MED FILL — Alprazolam Tab 0.5 MG: ORAL | Qty: 90 | Fill #0 | Status: CN

## 2020-11-23 ENCOUNTER — Other Ambulatory Visit (HOSPITAL_COMMUNITY): Payer: Self-pay

## 2021-01-10 ENCOUNTER — Other Ambulatory Visit (HOSPITAL_BASED_OUTPATIENT_CLINIC_OR_DEPARTMENT_OTHER): Payer: Self-pay

## 2021-01-10 MED ORDER — ALPRAZOLAM 0.5 MG PO TABS
ORAL_TABLET | ORAL | 3 refills | Status: DC
  Filled 2021-01-10: qty 90, 30d supply, fill #0

## 2021-01-10 MED ORDER — LEVOTHYROXINE SODIUM 75 MCG PO TABS
ORAL_TABLET | ORAL | 0 refills | Status: DC
  Filled 2021-01-10 (×2): qty 90, 90d supply, fill #0

## 2021-01-10 MED FILL — Lisinopril Tab 10 MG: ORAL | 90 days supply | Qty: 90 | Fill #1 | Status: AC

## 2021-01-11 ENCOUNTER — Ambulatory Visit: Payer: 59 | Attending: Internal Medicine

## 2021-01-11 DIAGNOSIS — Z23 Encounter for immunization: Secondary | ICD-10-CM

## 2021-01-11 NOTE — Progress Notes (Signed)
   Covid-19 Vaccination Clinic  Name:  Chelsea Curtis    MRN: [id number] DOB: [birth year]  01/11/2021  [name] was observed post Covid-19 immunization for 15 minutes without incident. She was provided with Vaccine Information Sheet and instruction to access the V-Safe system.   [name] was instructed to call 911 with any severe reactions post vaccine: Difficulty breathing  Swelling of face and throat  A fast heartbeat  A bad rash all over body  Dizziness and weakness   Immunizations Administered     Name Date Dose VIS Date Route   PFIZER Comrnaty(Gray TOP) Covid-19 Vaccine 01/11/2021  3:23 PM 0.3 mL 05/25/2020 Intramuscular   Manufacturer: [employer], [employer]   Lot: [id number]   NDC: [phone]

## 2021-01-12 ENCOUNTER — Other Ambulatory Visit (HOSPITAL_BASED_OUTPATIENT_CLINIC_OR_DEPARTMENT_OTHER): Payer: Self-pay

## 2021-01-12 DIAGNOSIS — M9903 Segmental and somatic dysfunction of lumbar region: Secondary | ICD-10-CM | POA: Diagnosis not present

## 2021-01-12 DIAGNOSIS — M9902 Segmental and somatic dysfunction of thoracic region: Secondary | ICD-10-CM | POA: Diagnosis not present

## 2021-01-12 DIAGNOSIS — M9901 Segmental and somatic dysfunction of cervical region: Secondary | ICD-10-CM | POA: Diagnosis not present

## 2021-01-12 DIAGNOSIS — M531 Cervicobrachial syndrome: Secondary | ICD-10-CM | POA: Diagnosis not present

## 2021-01-12 DIAGNOSIS — M9904 Segmental and somatic dysfunction of sacral region: Secondary | ICD-10-CM | POA: Diagnosis not present

## 2021-01-12 DIAGNOSIS — M5417 Radiculopathy, lumbosacral region: Secondary | ICD-10-CM | POA: Diagnosis not present

## 2021-01-12 DIAGNOSIS — M791 Myalgia, unspecified site: Secondary | ICD-10-CM | POA: Diagnosis not present

## 2021-01-16 ENCOUNTER — Other Ambulatory Visit (HOSPITAL_BASED_OUTPATIENT_CLINIC_OR_DEPARTMENT_OTHER): Payer: Self-pay

## 2021-01-16 MED ORDER — COVID-19 MRNA VAC-TRIS(PFIZER) 30 MCG/0.3ML IM SUSP
INTRAMUSCULAR | 0 refills | Status: DC
  Filled 2021-01-16: qty 0.3, 1d supply, fill #0

## 2021-01-17 DIAGNOSIS — M791 Myalgia, unspecified site: Secondary | ICD-10-CM | POA: Diagnosis not present

## 2021-01-17 DIAGNOSIS — M9904 Segmental and somatic dysfunction of sacral region: Secondary | ICD-10-CM | POA: Diagnosis not present

## 2021-01-17 DIAGNOSIS — M5417 Radiculopathy, lumbosacral region: Secondary | ICD-10-CM | POA: Diagnosis not present

## 2021-01-17 DIAGNOSIS — M9902 Segmental and somatic dysfunction of thoracic region: Secondary | ICD-10-CM | POA: Diagnosis not present

## 2021-01-17 DIAGNOSIS — M531 Cervicobrachial syndrome: Secondary | ICD-10-CM | POA: Diagnosis not present

## 2021-01-17 DIAGNOSIS — M9901 Segmental and somatic dysfunction of cervical region: Secondary | ICD-10-CM | POA: Diagnosis not present

## 2021-01-17 DIAGNOSIS — M9903 Segmental and somatic dysfunction of lumbar region: Secondary | ICD-10-CM | POA: Diagnosis not present

## 2021-01-24 DIAGNOSIS — M791 Myalgia, unspecified site: Secondary | ICD-10-CM | POA: Diagnosis not present

## 2021-01-24 DIAGNOSIS — M9904 Segmental and somatic dysfunction of sacral region: Secondary | ICD-10-CM | POA: Diagnosis not present

## 2021-01-24 DIAGNOSIS — M5417 Radiculopathy, lumbosacral region: Secondary | ICD-10-CM | POA: Diagnosis not present

## 2021-01-24 DIAGNOSIS — M9901 Segmental and somatic dysfunction of cervical region: Secondary | ICD-10-CM | POA: Diagnosis not present

## 2021-01-24 DIAGNOSIS — M531 Cervicobrachial syndrome: Secondary | ICD-10-CM | POA: Diagnosis not present

## 2021-01-24 DIAGNOSIS — M9902 Segmental and somatic dysfunction of thoracic region: Secondary | ICD-10-CM | POA: Diagnosis not present

## 2021-01-24 DIAGNOSIS — M9903 Segmental and somatic dysfunction of lumbar region: Secondary | ICD-10-CM | POA: Diagnosis not present

## 2021-02-21 DIAGNOSIS — M531 Cervicobrachial syndrome: Secondary | ICD-10-CM | POA: Diagnosis not present

## 2021-02-21 DIAGNOSIS — M9901 Segmental and somatic dysfunction of cervical region: Secondary | ICD-10-CM | POA: Diagnosis not present

## 2021-02-21 DIAGNOSIS — M9903 Segmental and somatic dysfunction of lumbar region: Secondary | ICD-10-CM | POA: Diagnosis not present

## 2021-02-21 DIAGNOSIS — M791 Myalgia, unspecified site: Secondary | ICD-10-CM | POA: Diagnosis not present

## 2021-02-21 DIAGNOSIS — M5417 Radiculopathy, lumbosacral region: Secondary | ICD-10-CM | POA: Diagnosis not present

## 2021-02-21 DIAGNOSIS — M9902 Segmental and somatic dysfunction of thoracic region: Secondary | ICD-10-CM | POA: Diagnosis not present

## 2021-02-21 DIAGNOSIS — M9904 Segmental and somatic dysfunction of sacral region: Secondary | ICD-10-CM | POA: Diagnosis not present

## 2021-03-21 DIAGNOSIS — M791 Myalgia, unspecified site: Secondary | ICD-10-CM | POA: Diagnosis not present

## 2021-03-21 DIAGNOSIS — M5417 Radiculopathy, lumbosacral region: Secondary | ICD-10-CM | POA: Diagnosis not present

## 2021-03-21 DIAGNOSIS — M531 Cervicobrachial syndrome: Secondary | ICD-10-CM | POA: Diagnosis not present

## 2021-03-21 DIAGNOSIS — M9903 Segmental and somatic dysfunction of lumbar region: Secondary | ICD-10-CM | POA: Diagnosis not present

## 2021-03-21 DIAGNOSIS — M9901 Segmental and somatic dysfunction of cervical region: Secondary | ICD-10-CM | POA: Diagnosis not present

## 2021-03-21 DIAGNOSIS — M9904 Segmental and somatic dysfunction of sacral region: Secondary | ICD-10-CM | POA: Diagnosis not present

## 2021-03-21 DIAGNOSIS — M9902 Segmental and somatic dysfunction of thoracic region: Secondary | ICD-10-CM | POA: Diagnosis not present

## 2021-03-30 ENCOUNTER — Other Ambulatory Visit (HOSPITAL_BASED_OUTPATIENT_CLINIC_OR_DEPARTMENT_OTHER): Payer: Self-pay

## 2021-03-30 DIAGNOSIS — Z131 Encounter for screening for diabetes mellitus: Secondary | ICD-10-CM | POA: Diagnosis not present

## 2021-03-30 DIAGNOSIS — Z136 Encounter for screening for cardiovascular disorders: Secondary | ICD-10-CM | POA: Diagnosis not present

## 2021-03-30 DIAGNOSIS — E039 Hypothyroidism, unspecified: Secondary | ICD-10-CM | POA: Diagnosis not present

## 2021-03-30 DIAGNOSIS — Z0000 Encounter for general adult medical examination without abnormal findings: Secondary | ICD-10-CM | POA: Diagnosis not present

## 2021-03-30 DIAGNOSIS — F419 Anxiety disorder, unspecified: Secondary | ICD-10-CM | POA: Diagnosis not present

## 2021-03-30 DIAGNOSIS — I10 Essential (primary) hypertension: Secondary | ICD-10-CM | POA: Diagnosis not present

## 2021-03-30 MED ORDER — VENLAFAXINE HCL ER 37.5 MG PO CP24
ORAL_CAPSULE | ORAL | 3 refills | Status: DC
  Filled 2021-03-30: qty 90, 90d supply, fill #0
  Filled 2021-07-16: qty 90, 90d supply, fill #1
  Filled 2021-10-09: qty 90, 90d supply, fill #2
  Filled 2022-01-07: qty 90, 90d supply, fill #3

## 2021-03-30 MED ORDER — ALPRAZOLAM 0.5 MG PO TABS
ORAL_TABLET | ORAL | 5 refills | Status: DC
  Filled 2021-03-30: qty 90, 30d supply, fill #0
  Filled 2021-07-16: qty 90, 30d supply, fill #1

## 2021-03-30 MED ORDER — LEVOTHYROXINE SODIUM 75 MCG PO TABS
ORAL_TABLET | ORAL | 3 refills | Status: DC
  Filled 2021-03-30 – 2021-04-23 (×3): qty 90, 90d supply, fill #0
  Filled 2021-07-16: qty 90, 90d supply, fill #1
  Filled 2021-10-09: qty 90, 90d supply, fill #2
  Filled 2022-01-07: qty 90, 90d supply, fill #3

## 2021-03-30 MED ORDER — LISINOPRIL 10 MG PO TABS
ORAL_TABLET | ORAL | 3 refills | Status: DC
  Filled 2021-03-30: qty 90, 90d supply, fill #0
  Filled 2021-07-16: qty 90, 90d supply, fill #1
  Filled 2021-10-09: qty 90, 90d supply, fill #2
  Filled 2022-01-07: qty 90, 90d supply, fill #3

## 2021-04-04 ENCOUNTER — Other Ambulatory Visit (HOSPITAL_BASED_OUTPATIENT_CLINIC_OR_DEPARTMENT_OTHER): Payer: Self-pay

## 2021-04-05 ENCOUNTER — Other Ambulatory Visit (HOSPITAL_BASED_OUTPATIENT_CLINIC_OR_DEPARTMENT_OTHER): Payer: Self-pay

## 2021-04-16 ENCOUNTER — Other Ambulatory Visit (HOSPITAL_BASED_OUTPATIENT_CLINIC_OR_DEPARTMENT_OTHER): Payer: Self-pay

## 2021-04-23 ENCOUNTER — Other Ambulatory Visit (HOSPITAL_BASED_OUTPATIENT_CLINIC_OR_DEPARTMENT_OTHER): Payer: Self-pay

## 2021-05-28 DIAGNOSIS — H35362 Drusen (degenerative) of macula, left eye: Secondary | ICD-10-CM | POA: Diagnosis not present

## 2021-05-28 DIAGNOSIS — H524 Presbyopia: Secondary | ICD-10-CM | POA: Diagnosis not present

## 2021-05-28 DIAGNOSIS — H18453 Nodular corneal degeneration, bilateral: Secondary | ICD-10-CM | POA: Diagnosis not present

## 2021-05-28 DIAGNOSIS — H35361 Drusen (degenerative) of macula, right eye: Secondary | ICD-10-CM | POA: Diagnosis not present

## 2021-07-16 ENCOUNTER — Ambulatory Visit: Payer: 59 | Attending: Internal Medicine

## 2021-07-16 ENCOUNTER — Other Ambulatory Visit (HOSPITAL_BASED_OUTPATIENT_CLINIC_OR_DEPARTMENT_OTHER): Payer: Self-pay

## 2021-07-16 DIAGNOSIS — Z23 Encounter for immunization: Secondary | ICD-10-CM

## 2021-07-16 NOTE — Progress Notes (Signed)
° °  Covid-19 Vaccination Clinic  Name:  Chelsea Curtis    MRN: [id number] DOB: Sep 27, 1960  07/16/2021  [name] was observed post Covid-19 immunization for 15 minutes without incident. She was provided with Vaccine Information Sheet and instruction to access the V-Safe system.   [name] was instructed to call 911 with any severe reactions post vaccine: Difficulty breathing  Swelling of face and throat  A fast heartbeat  A bad rash all over body  Dizziness and weakness   Immunizations Administered     Name Date Dose VIS Date Route   Pfizer Covid-19 Vaccine Bivalent Booster 07/16/2021  2:40 PM 0.3 mL 02/14/2021 Intramuscular   Manufacturer: [employer], [employer]   Lot: [id number]   NDC: [phone]

## 2021-07-17 ENCOUNTER — Other Ambulatory Visit (HOSPITAL_BASED_OUTPATIENT_CLINIC_OR_DEPARTMENT_OTHER): Payer: Self-pay

## 2021-07-20 ENCOUNTER — Other Ambulatory Visit (HOSPITAL_BASED_OUTPATIENT_CLINIC_OR_DEPARTMENT_OTHER): Payer: Self-pay

## 2021-07-20 MED ORDER — PFIZER COVID-19 VAC BIVALENT 30 MCG/0.3ML IM SUSP
INTRAMUSCULAR | 0 refills | Status: DC
  Filled 2021-07-20: qty 0.3, 1d supply, fill #0

## 2021-10-09 ENCOUNTER — Other Ambulatory Visit (HOSPITAL_BASED_OUTPATIENT_CLINIC_OR_DEPARTMENT_OTHER): Payer: Self-pay

## 2021-10-10 ENCOUNTER — Other Ambulatory Visit (HOSPITAL_BASED_OUTPATIENT_CLINIC_OR_DEPARTMENT_OTHER): Payer: Self-pay

## 2021-12-07 ENCOUNTER — Other Ambulatory Visit (HOSPITAL_BASED_OUTPATIENT_CLINIC_OR_DEPARTMENT_OTHER): Payer: Self-pay

## 2022-01-07 ENCOUNTER — Other Ambulatory Visit (HOSPITAL_BASED_OUTPATIENT_CLINIC_OR_DEPARTMENT_OTHER): Payer: Self-pay

## 2022-04-03 ENCOUNTER — Other Ambulatory Visit (HOSPITAL_BASED_OUTPATIENT_CLINIC_OR_DEPARTMENT_OTHER): Payer: Self-pay

## 2022-04-03 MED ORDER — VENLAFAXINE HCL ER 37.5 MG PO CP24
37.5000 mg | ORAL_CAPSULE | Freq: Every day | ORAL | 0 refills | Status: DC
  Filled 2022-04-05: qty 90, 90d supply, fill #0

## 2022-04-03 MED ORDER — LEVOTHYROXINE SODIUM 75 MCG PO TABS
75.0000 ug | ORAL_TABLET | Freq: Every morning | ORAL | 0 refills | Status: DC
  Filled 2022-04-05: qty 4, 4d supply, fill #0
  Filled 2022-04-05: qty 86, 86d supply, fill #0

## 2022-04-03 MED ORDER — LISINOPRIL 10 MG PO TABS
10.0000 mg | ORAL_TABLET | Freq: Every day | ORAL | 0 refills | Status: DC
  Filled 2022-04-05: qty 90, 90d supply, fill #0

## 2022-04-04 ENCOUNTER — Other Ambulatory Visit (HOSPITAL_BASED_OUTPATIENT_CLINIC_OR_DEPARTMENT_OTHER): Payer: Self-pay

## 2022-04-05 ENCOUNTER — Other Ambulatory Visit (HOSPITAL_BASED_OUTPATIENT_CLINIC_OR_DEPARTMENT_OTHER): Payer: Self-pay

## 2022-04-08 ENCOUNTER — Other Ambulatory Visit (HOSPITAL_BASED_OUTPATIENT_CLINIC_OR_DEPARTMENT_OTHER): Payer: Self-pay

## 2022-05-06 ENCOUNTER — Other Ambulatory Visit (HOSPITAL_BASED_OUTPATIENT_CLINIC_OR_DEPARTMENT_OTHER): Payer: Self-pay

## 2022-05-06 MED ORDER — LAGEVRIO 200 MG PO CAPS
4.0000 | ORAL_CAPSULE | Freq: Two times a day (BID) | ORAL | 0 refills | Status: DC
  Filled 2022-05-06: qty 40, 5d supply, fill #0

## 2022-05-06 MED ORDER — GUAIFENESIN AC 100-10 MG/5ML PO SYRP
10.0000 mL | ORAL_SOLUTION | ORAL | 0 refills | Status: DC | PRN
  Filled 2022-05-06: qty 300, 5d supply, fill #0

## 2022-06-27 ENCOUNTER — Other Ambulatory Visit (HOSPITAL_BASED_OUTPATIENT_CLINIC_OR_DEPARTMENT_OTHER): Payer: Self-pay

## 2022-06-27 MED ORDER — LEVOTHYROXINE SODIUM 75 MCG PO TABS
75.0000 ug | ORAL_TABLET | Freq: Every morning | ORAL | 0 refills | Status: AC
  Filled 2022-06-27: qty 90, 90d supply, fill #0

## 2022-06-27 MED ORDER — LISINOPRIL 10 MG PO TABS
10.0000 mg | ORAL_TABLET | Freq: Every day | ORAL | 0 refills | Status: DC
  Filled 2022-06-27: qty 90, 90d supply, fill #0

## 2022-06-27 MED ORDER — VENLAFAXINE HCL ER 37.5 MG PO CP24
37.5000 mg | ORAL_CAPSULE | Freq: Every day | ORAL | 0 refills | Status: DC
  Filled 2022-06-27: qty 90, 90d supply, fill #0

## 2022-06-28 ENCOUNTER — Other Ambulatory Visit (HOSPITAL_BASED_OUTPATIENT_CLINIC_OR_DEPARTMENT_OTHER): Payer: Self-pay

## 2022-07-02 ENCOUNTER — Other Ambulatory Visit (HOSPITAL_BASED_OUTPATIENT_CLINIC_OR_DEPARTMENT_OTHER): Payer: Self-pay

## 2022-07-04 ENCOUNTER — Other Ambulatory Visit (HOSPITAL_BASED_OUTPATIENT_CLINIC_OR_DEPARTMENT_OTHER): Payer: Self-pay

## 2022-07-05 ENCOUNTER — Other Ambulatory Visit (HOSPITAL_BASED_OUTPATIENT_CLINIC_OR_DEPARTMENT_OTHER): Payer: Self-pay

## 2022-07-15 ENCOUNTER — Other Ambulatory Visit (HOSPITAL_BASED_OUTPATIENT_CLINIC_OR_DEPARTMENT_OTHER): Payer: Self-pay

## 2022-07-22 ENCOUNTER — Other Ambulatory Visit (HOSPITAL_BASED_OUTPATIENT_CLINIC_OR_DEPARTMENT_OTHER): Payer: Self-pay

## 2022-07-22 DIAGNOSIS — Z124 Encounter for screening for malignant neoplasm of cervix: Secondary | ICD-10-CM | POA: Diagnosis not present

## 2022-07-22 DIAGNOSIS — Z1231 Encounter for screening mammogram for malignant neoplasm of breast: Secondary | ICD-10-CM | POA: Diagnosis not present

## 2022-07-22 DIAGNOSIS — Z1211 Encounter for screening for malignant neoplasm of colon: Secondary | ICD-10-CM | POA: Diagnosis not present

## 2022-07-22 DIAGNOSIS — I10 Essential (primary) hypertension: Secondary | ICD-10-CM | POA: Diagnosis not present

## 2022-07-22 DIAGNOSIS — Z7185 Encounter for immunization safety counseling: Secondary | ICD-10-CM | POA: Diagnosis not present

## 2022-07-22 DIAGNOSIS — Z13220 Encounter for screening for lipoid disorders: Secondary | ICD-10-CM | POA: Diagnosis not present

## 2022-07-22 DIAGNOSIS — E039 Hypothyroidism, unspecified: Secondary | ICD-10-CM | POA: Diagnosis not present

## 2022-07-22 DIAGNOSIS — Z0000 Encounter for general adult medical examination without abnormal findings: Secondary | ICD-10-CM | POA: Diagnosis not present

## 2022-07-22 DIAGNOSIS — F418 Other specified anxiety disorders: Secondary | ICD-10-CM | POA: Diagnosis not present

## 2022-07-22 DIAGNOSIS — M810 Age-related osteoporosis without current pathological fracture: Secondary | ICD-10-CM | POA: Diagnosis not present

## 2022-07-22 MED ORDER — VENLAFAXINE HCL ER 75 MG PO CP24
75.0000 mg | ORAL_CAPSULE | Freq: Every day | ORAL | 1 refills | Status: DC
  Filled 2022-07-22: qty 90, 90d supply, fill #0
  Filled 2022-10-16: qty 90, 90d supply, fill #1

## 2022-07-23 ENCOUNTER — Other Ambulatory Visit: Payer: Self-pay | Admitting: Family Medicine

## 2022-07-23 DIAGNOSIS — Z1231 Encounter for screening mammogram for malignant neoplasm of breast: Secondary | ICD-10-CM

## 2022-07-23 DIAGNOSIS — M810 Age-related osteoporosis without current pathological fracture: Secondary | ICD-10-CM

## 2022-08-12 IMAGING — MG MM DIGITAL SCREENING BILAT W/ TOMO AND CAD
8 series · 8 of 24 positions shown · non-contrast
Comparison: Previous exam(s).

CLINICAL DATA: Screening.

EXAM:
DIGITAL SCREENING BILATERAL MAMMOGRAM WITH TOMOSYNTHESIS AND CAD
TECHNIQUE: Bilateral screening digital craniocaudal and mediolateral oblique
mammograms were obtained. Bilateral screening digital breast
tomosynthesis was performed. The images were evaluated with
computer-aided detection.

[L MLO synth-2D]
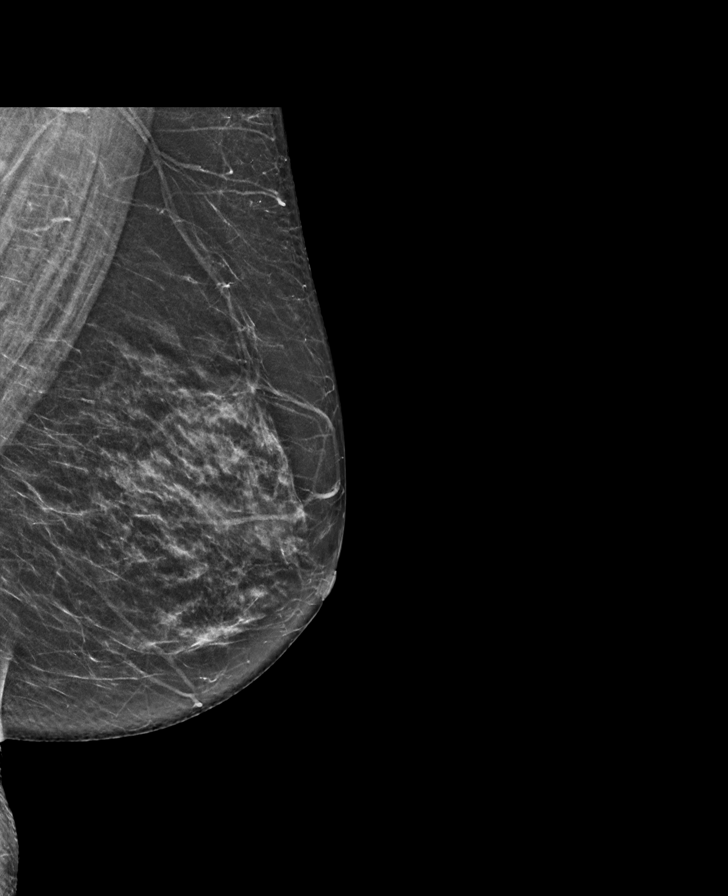

[R CC synth-2D]
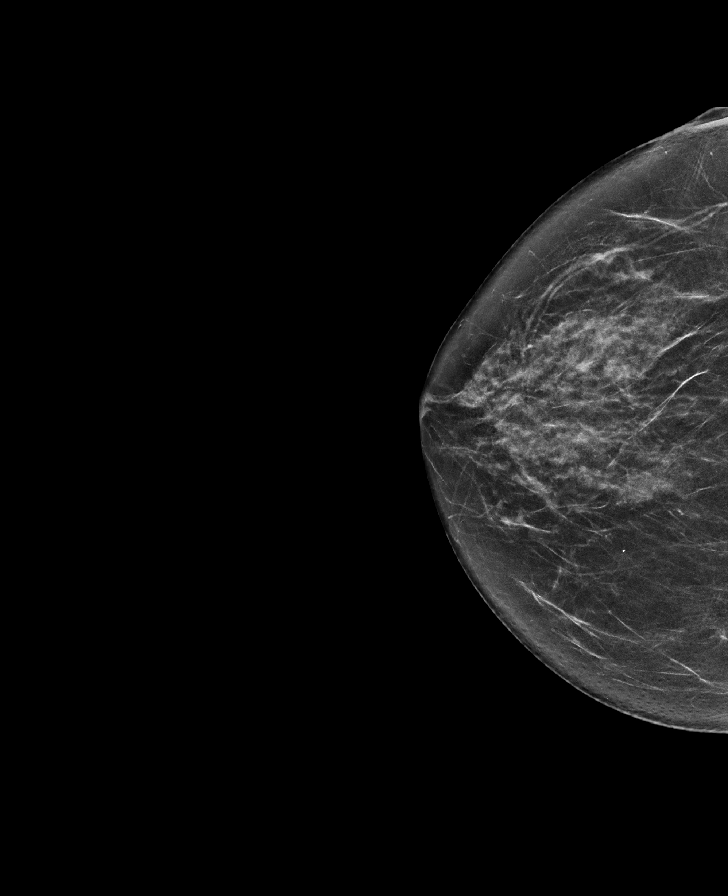

[L CC synth-2D]
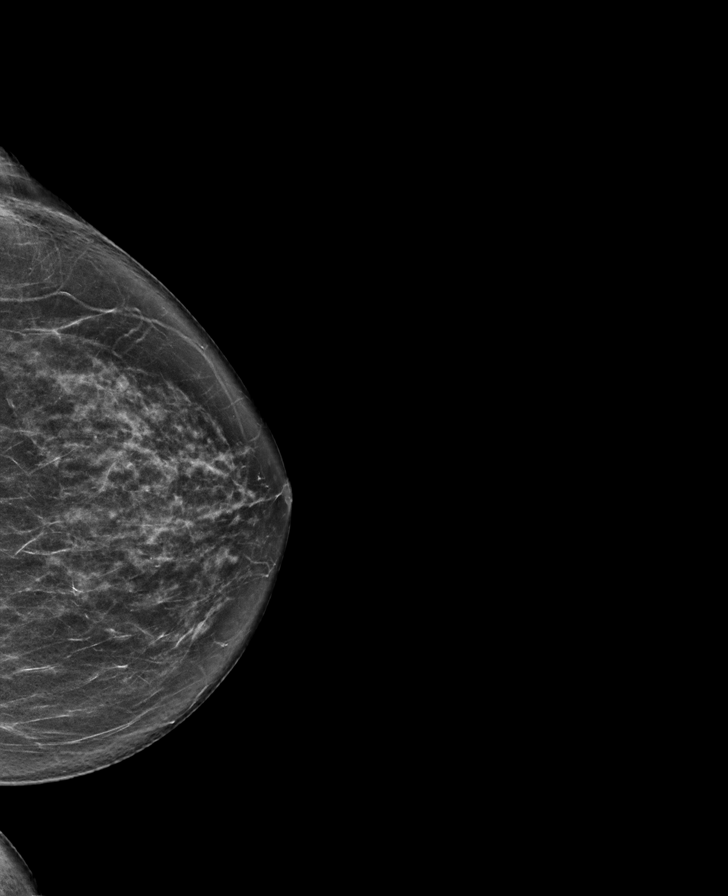

[R MLO synth-2D]
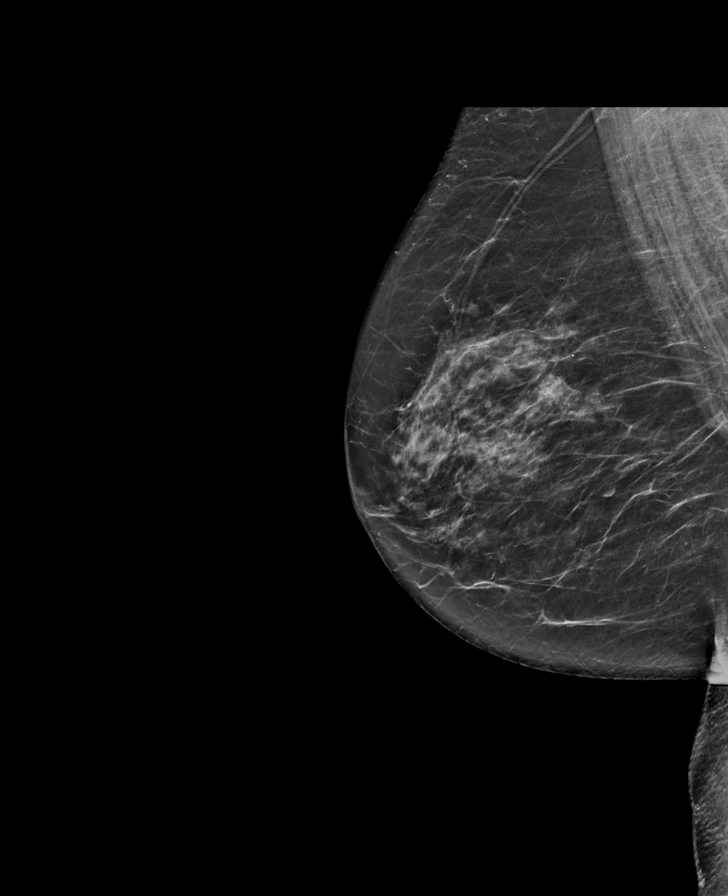

[L MLO tomo · tomo slice 33/64.0]
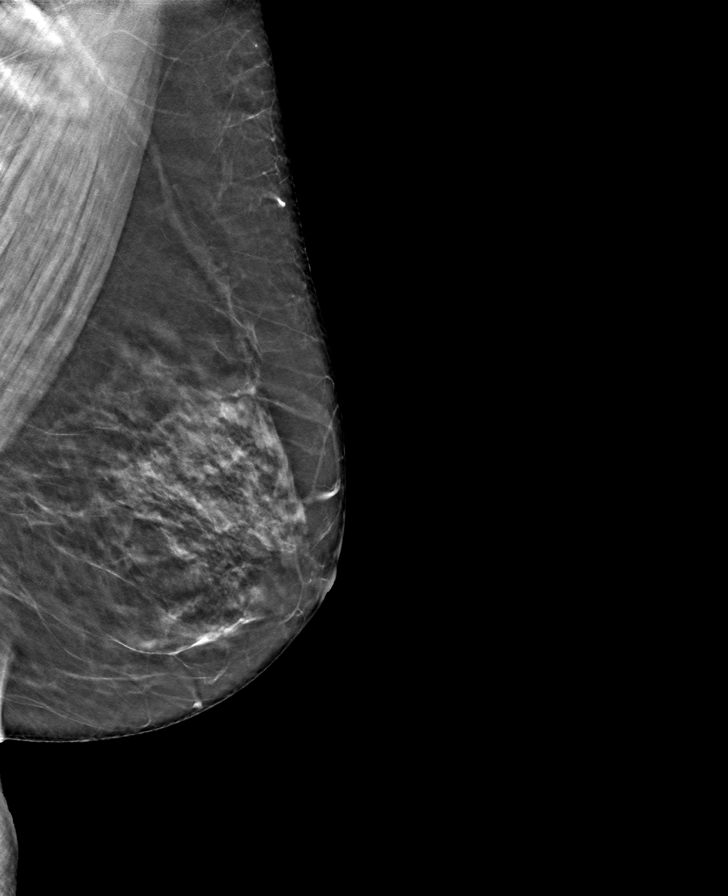

[R CC tomo · tomo slice 33/66.0]
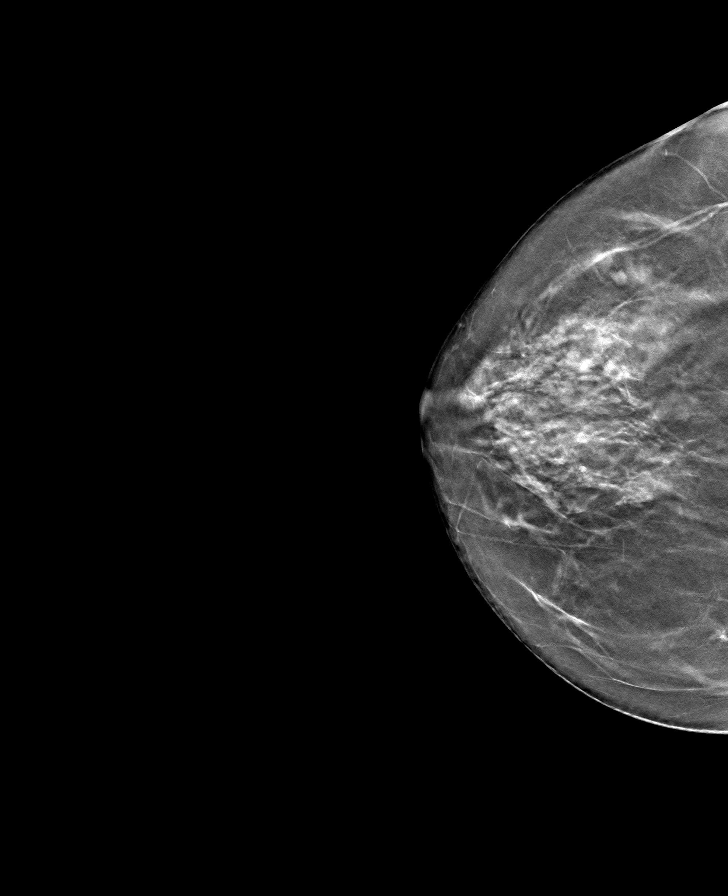

[R MLO tomo · tomo slice 35/68.0]
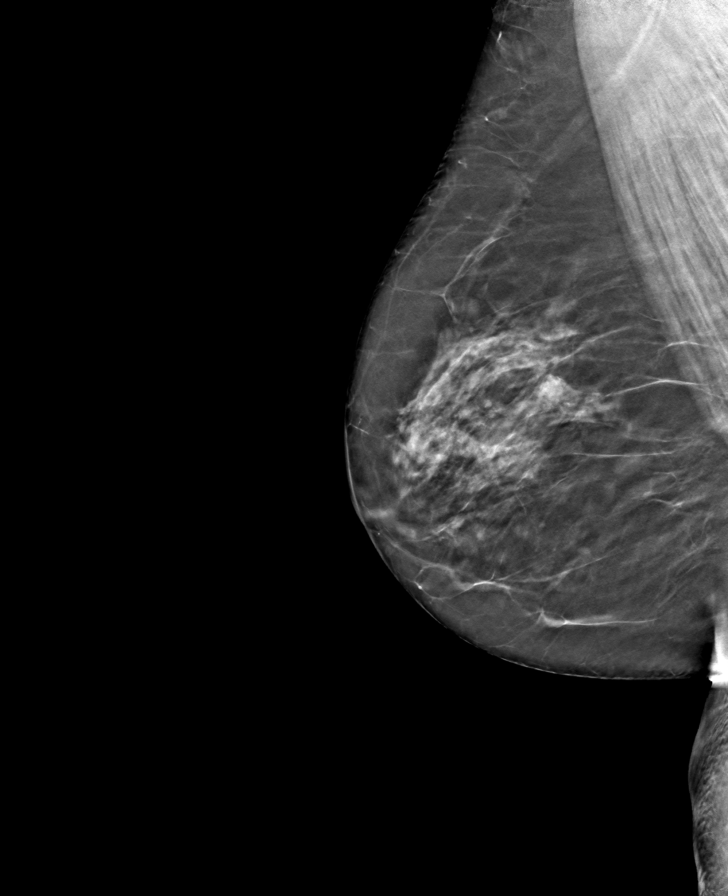

[L CC tomo · tomo slice 35/69.0]
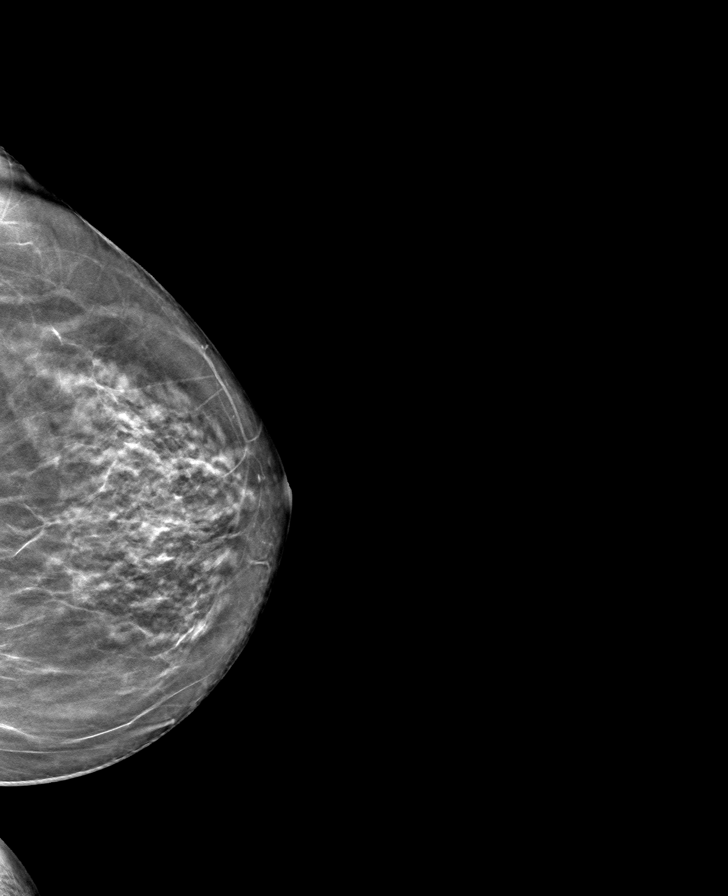

[8 of 24 positions shown; findings below may reference images not displayed]

ACR Breast Density Category c: The breast tissue is heterogeneously
dense, which may obscure small masses.
FINDINGS: There are no findings suspicious for malignancy. The images were
evaluated with computer-aided detection.
IMPRESSION: No mammographic evidence of malignancy. A result letter of this
screening mammogram will be mailed directly to the patient.

RECOMMENDATION:
Screening mammogram in one year. (Code:T4-5-GWO)

BI-RADS CATEGORY  1: Negative.

## 2022-09-12 ENCOUNTER — Other Ambulatory Visit: Payer: Self-pay

## 2022-10-16 ENCOUNTER — Other Ambulatory Visit (HOSPITAL_BASED_OUTPATIENT_CLINIC_OR_DEPARTMENT_OTHER): Payer: Self-pay

## 2022-10-16 ENCOUNTER — Other Ambulatory Visit: Payer: Self-pay

## 2022-10-16 MED ORDER — LISINOPRIL 10 MG PO TABS
10.0000 mg | ORAL_TABLET | Freq: Every day | ORAL | 1 refills | Status: DC
  Filled 2022-10-16: qty 90, 90d supply, fill #0
  Filled 2023-03-14: qty 90, 90d supply, fill #1

## 2022-10-23 ENCOUNTER — Other Ambulatory Visit (HOSPITAL_BASED_OUTPATIENT_CLINIC_OR_DEPARTMENT_OTHER): Payer: Self-pay

## 2022-10-23 ENCOUNTER — Telehealth: Payer: Self-pay | Admitting: Nurse Practitioner

## 2022-10-23 DIAGNOSIS — J029 Acute pharyngitis, unspecified: Secondary | ICD-10-CM

## 2022-10-23 DIAGNOSIS — W57XXXA Bitten or stung by nonvenomous insect and other nonvenomous arthropods, initial encounter: Secondary | ICD-10-CM

## 2022-10-23 MED ORDER — DOXYCYCLINE HYCLATE 100 MG PO TABS
100.0000 mg | ORAL_TABLET | Freq: Two times a day (BID) | ORAL | 0 refills | Status: AC
  Filled 2022-10-23: qty 42, 21d supply, fill #0

## 2022-10-23 NOTE — Progress Notes (Signed)
E-Visit for Tick Bite   Thank you for describing your tick bite, Here is how we plan to help! Based on the information that you shared with me it looks like you have An infected or complicated tick bite that requires a longer course of antibiotics and will need for you to schedule a follow-up visit with a provider.   In most cases a tick bite is painless and does not itch.  Most tick bites in which the tick is quickly removed do not require prescriptions. Ticks can transmit several diseases if they are infected and remain attacked to your skin. Therefore the length that the tick was attached and any symptoms you have experienced after the bite are import to accurately develop your custom treatment plan. In most cases a single dose of doxycycline may prevent the development of a more serious condition.   Based on your information I have Provided a home care guide for tick bites and  instructions on when to call for help. and Your symptoms indicate that you need a longer course of antibiotics and a follow up visit with a provider. I have sent doxycycline 100 mg twice a day for 21 days to the pharmacy that you selected. You will need to schedule a follow up visit with your provider. If you do not have a primary care provider you may use our telehealth physicians on the web at [email or web address]   Which ticks  are associated with illness?   The Wood Tick (dog tick) is the size of a watermelon seed and can sometimes transmit Rocky [place] spotted fever and [place] tick fever.    The Deer Tick (black-legged tick) is between the size of a poppy seed (pin head) and an apple seed, and can sometimes transmit Lyme disease.   A brown to black tick with a white splotch on its back is likely a female Amblyomma americanum ([place] tick). This tick has been associated with Southern Tick Associated illness ( STARI)   Lyme disease has become the most common tick-borne illness in the [place]. The risk of  Lyme disease following a recognized deer tick bite is estimated to be 1%.  The majority of cases of Lyme disease start with a bull's eye rash at the site of the tick bite. The rash can occur days to weeks (typically 7-10 days) after a tick bite. Treatment with antibiotics is indicated if this rash appears. Flu-like symptoms may accompany the rash, including: fever, chills, headaches, muscle aches, and fatigue. Removing ticks promptly may prevent tick borne disease.   What can be used to prevent Tick Bites?   Insect repellant with at leas 20% DEET. Wearing long pants with sock and shoes. Avoiding tall grass and heavily wooded areas. Checking your skin after being outdoors. Shower with a washcloth after outdoor exposures.   HOME CARE ADVICE FOR TICK BITE   Wood Tick Removal:  Use a pair of tweezers and grasp the wood tick close to the skin (on its head). Pull the wood tick straight upward without twisting or crushing it. Maintain a steady pressure until it releases its grip.   If tweezers aren't available, use fingers, a loop of thread around the jaws, or a needle between the jaws for traction.  Note: covering the tick with petroleum jelly, nail polish or rubbing alcohol doesn't work. Neither does touching the tick with a hot or cold object. Tiny Deer Tick Removal:   Needs to be scraped off with a knife blade or   credit card edge. Place tick in a sealed container (e.g. glass jar, zip lock plastic bag), in case your doctor wants to see it. Tick's Head Removal:  If the wood tick's head breaks off in the skin, it must be removed. Clean the skin. Then use a sterile needle to uncover the head and lift it out or scrape it off.  If a very small piece of the head remains, the skin will eventually slough it off. Antibiotic Ointment:  Wash the wound and your hands with soap and water after removal to prevent catching any tick disease.  Apply an over the counter antibiotic ointment (e.g. bacitracin) to  the bite once. Expected Course: Tick bites normally don't itch or hurt. That's why they often go unnoticed. Call Your Doctor If:  You can't remove the tick or the tick's head Fever, a severe head ache, or rash occur in the next 2 weeks Bite begins to look infected Lyme's disease is common in your area You have not had a tetanus in the last 10 years Your current symptoms become worse      MAKE SURE YOU  Understand these instructions. Will watch your condition. Will get help right away if you are not doing well or get worse.       Thank you for choosing an e-visit.   Your e-visit answers were reviewed by a board certified advanced clinical practitioner to complete your personal care plan. Depending upon the condition, your plan could have included both over the counter or prescription medications.   Please review your pharmacy choice. Make sure the pharmacy is open so you can pick up prescription now. If there is a problem, you may contact your provider through MyChart messaging and have the prescription routed to another pharmacy.  Your safety is important to us. If you have drug allergies check your prescription carefully.    For the next 24 hours you can use MyChart to ask questions about today's visit, request a non-urgent call back, or ask for a work or school excuse. You will get an email in the next two days asking about your experience. I hope that your e-visit has been valuable and will speed your recovery.      Meds ordered this encounter  Medications   doxycycline (VIBRA-TABS) 100 MG tablet    Sig: Take 1 tablet (100 mg total) by mouth 2 (two) times daily for 21 days.    Dispense:  42 tablet    Refill:  0     I spent approximately 5 minutes reviewing the patient's history, current symptoms and coordinating their care today.   

## 2022-10-24 ENCOUNTER — Ambulatory Visit
Admission: RE | Admit: 2022-10-24 | Discharge: 2022-10-24 | Disposition: A | Discharge status: Home / Self Care | Payer: 59 | Source: Ambulatory Visit | Attending: Family Medicine | Admitting: Family Medicine

## 2022-10-24 DIAGNOSIS — Z1231 Encounter for screening mammogram for malignant neoplasm of breast: Secondary | ICD-10-CM

## 2022-10-30 ENCOUNTER — Other Ambulatory Visit (HOSPITAL_BASED_OUTPATIENT_CLINIC_OR_DEPARTMENT_OTHER): Payer: Self-pay

## 2022-10-30 DIAGNOSIS — J069 Acute upper respiratory infection, unspecified: Secondary | ICD-10-CM | POA: Diagnosis not present

## 2022-10-30 DIAGNOSIS — W57XXXD Bitten or stung by nonvenomous insect and other nonvenomous arthropods, subsequent encounter: Secondary | ICD-10-CM | POA: Diagnosis not present

## 2022-10-30 DIAGNOSIS — S80862D Insect bite (nonvenomous), left lower leg, subsequent encounter: Secondary | ICD-10-CM | POA: Diagnosis not present

## 2022-10-30 MED ORDER — ALBUTEROL SULFATE HFA 108 (90 BASE) MCG/ACT IN AERS
1.0000 | INHALATION_SPRAY | RESPIRATORY_TRACT | 0 refills | Status: AC | PRN
  Filled 2022-10-30: qty 6.7, 30d supply, fill #0

## 2022-12-20 ENCOUNTER — Other Ambulatory Visit (HOSPITAL_BASED_OUTPATIENT_CLINIC_OR_DEPARTMENT_OTHER): Payer: Self-pay

## 2022-12-20 DIAGNOSIS — F439 Reaction to severe stress, unspecified: Secondary | ICD-10-CM | POA: Diagnosis not present

## 2022-12-20 DIAGNOSIS — F339 Major depressive disorder, recurrent, unspecified: Secondary | ICD-10-CM | POA: Diagnosis not present

## 2022-12-20 MED ORDER — VENLAFAXINE HCL ER 150 MG PO CP24
150.0000 mg | ORAL_CAPSULE | Freq: Every day | ORAL | 1 refills | Status: DC
  Filled 2022-12-20: qty 90, 90d supply, fill #0
  Filled 2023-03-15: qty 90, 90d supply, fill #1

## 2023-03-17 ENCOUNTER — Other Ambulatory Visit (HOSPITAL_BASED_OUTPATIENT_CLINIC_OR_DEPARTMENT_OTHER): Payer: Self-pay

## 2023-03-18 ENCOUNTER — Inpatient Hospital Stay
Admission: RE | Admit: 2023-03-18 | Discharge: 2023-03-18 | Discharge status: Home / Self Care | Payer: 59 | Source: Ambulatory Visit | Attending: Family Medicine | Admitting: Family Medicine

## 2023-03-18 DIAGNOSIS — M8588 Other specified disorders of bone density and structure, other site: Secondary | ICD-10-CM | POA: Diagnosis not present

## 2023-03-18 DIAGNOSIS — E349 Endocrine disorder, unspecified: Secondary | ICD-10-CM | POA: Diagnosis not present

## 2023-03-18 DIAGNOSIS — M810 Age-related osteoporosis without current pathological fracture: Secondary | ICD-10-CM

## 2023-03-18 DIAGNOSIS — N958 Other specified menopausal and perimenopausal disorders: Secondary | ICD-10-CM | POA: Diagnosis not present

## 2023-03-18 DIAGNOSIS — Z90722 Acquired absence of ovaries, bilateral: Secondary | ICD-10-CM | POA: Diagnosis not present

## 2023-03-31 DIAGNOSIS — H35362 Drusen (degenerative) of macula, left eye: Secondary | ICD-10-CM | POA: Diagnosis not present

## 2023-03-31 DIAGNOSIS — H35361 Drusen (degenerative) of macula, right eye: Secondary | ICD-10-CM | POA: Diagnosis not present

## 2023-03-31 DIAGNOSIS — H18453 Nodular corneal degeneration, bilateral: Secondary | ICD-10-CM | POA: Diagnosis not present

## 2023-04-23 ENCOUNTER — Other Ambulatory Visit (HOSPITAL_BASED_OUTPATIENT_CLINIC_OR_DEPARTMENT_OTHER): Payer: Self-pay

## 2023-04-23 DIAGNOSIS — F339 Major depressive disorder, recurrent, unspecified: Secondary | ICD-10-CM | POA: Diagnosis not present

## 2023-04-23 DIAGNOSIS — I10 Essential (primary) hypertension: Secondary | ICD-10-CM | POA: Diagnosis not present

## 2023-04-23 MED ORDER — VENLAFAXINE HCL ER 150 MG PO CP24
150.0000 mg | ORAL_CAPSULE | Freq: Every day | ORAL | 1 refills | Status: AC
  Filled 2023-06-13: qty 90, 90d supply, fill #0
  Filled 2023-10-23: qty 90, 90d supply, fill #1

## 2023-04-23 MED ORDER — LISINOPRIL 10 MG PO TABS
10.0000 mg | ORAL_TABLET | Freq: Every day | ORAL | 1 refills | Status: AC
  Filled 2023-06-13: qty 90, 90d supply, fill #0
  Filled 2023-09-30: qty 90, 90d supply, fill #1

## 2023-04-29 DIAGNOSIS — H35361 Drusen (degenerative) of macula, right eye: Secondary | ICD-10-CM | POA: Diagnosis not present

## 2023-04-29 DIAGNOSIS — H35362 Drusen (degenerative) of macula, left eye: Secondary | ICD-10-CM | POA: Diagnosis not present

## 2023-04-29 DIAGNOSIS — H18453 Nodular corneal degeneration, bilateral: Secondary | ICD-10-CM | POA: Diagnosis not present

## 2023-05-14 ENCOUNTER — Ambulatory Visit
Admission: RE | Admit: 2023-05-14 | Discharge: 2023-05-14 | Disposition: A | Discharge status: Home / Self Care | Payer: 59 | Source: Ambulatory Visit | Attending: Family Medicine | Admitting: Family Medicine

## 2023-05-14 VITALS — BP 153/81 | HR 71 | Temp 98.2°F | Resp 18 | Ht 64.0 in | Wt 185.0 lb

## 2023-05-14 DIAGNOSIS — J029 Acute pharyngitis, unspecified: Secondary | ICD-10-CM

## 2023-05-14 LAB — POCT RAPID STREP A (OFFICE): Rapid Strep A Screen: NEGATIVE

## 2023-05-14 NOTE — ED Provider Notes (Signed)
[facility]    CSN: [id number] Arrival date & time: 05/14/23  1507      History   Chief Complaint Chief Complaint  Patient presents with   Sore Throat    Sore throat feels like possible Strep throat. Slight Stuffy head. Covid home test Negative - Entered by patient    HPI Chelsea Curtis is a 62 y.o. female.   HPI  Patient just traveled to [place] and back.  She is here with respiratory infection.  She is worried about COVID.  She has had a sore throat, nasal congestion, fatigue, and headache.  Sore throat is severe at times.  She does have a slight stuffy head, sinus congestion.  Past Medical History:  Diagnosis Date   Blood transfusion without reported diagnosis    GERD (gastroesophageal reflux disease)    occasional but no medications    Osteopenia    Thyroid condition     There are no problems to display for this patient.   Past Surgical History:  Procedure Laterality Date   ABDOMINAL HYSTERECTOMY     APPENDECTOMY  1999   with tumor removal and tubal ligation   BREAST EXCISIONAL BIOPSY Right pt unsure   benign   CHOLECYSTECTOMY  1991   COLONOSCOPY     11-12 years ago [facility] -normal per pt -done for rectal bleeding   hip pin removal  1989   HIP PINNING  1987   INCISIONAL HERNIA REPAIR  2000   ovarian tumor removal  1999   TONSILLECTOMY AND ADENOIDECTOMY  1973   TUBAL LIGATION  1999   urethral polyps  1985   VAGINAL HYSTERECTOMY  1996   uterus only with a and p repair    OB History   No obstetric history on file.      Home Medications    Prior to Admission medications   Medication Sig Start Date End Date Taking? Authorizing Provider  albuterol (VENTOLIN HFA) 108 (90 Base) MCG/ACT inhaler Inhale 1 puff into the lungs every 4 (four) hours as needed. 10/30/22  Yes   aspirin 81 MG chewable tablet Chew 81 mg by mouth daily. Reported on 08/30/2015   Yes [provider]  Calcium-Phosphorus-Vitamin D (CITRACAL +D3 PO) Take by mouth  daily.   Yes [provider]  Cholecalciferol (VITAMIN D) 2000 units CAPS Take 2,000 Units by mouth daily.   Yes [provider]  levothyroxine (UNITHROID) 75 MCG tablet Take 1 tablet (75 mcg total) by mouth every morning on an empty stomach 06/27/22  Yes   lisinopril (ZESTRIL) 10 MG tablet Take 1 tablet (10 mg total) by mouth daily. 04/23/23  Yes   Multiple Vitamin (MULTIVITAMIN) tablet Take 1 tablet by mouth daily.   Yes [provider]  venlafaxine XR (EFFEXOR-XR) 150 MG 24 hr capsule Take 1 capsule (150 mg total) by mouth daily with food 04/23/23  Yes   buPROPion (WELLBUTRIN SR) 150 MG 12 hr tablet Take 150 mg by mouth 2 (two) times daily.    [provider]  Omega-3 Fatty Acids (FISH OIL PO) Take 1 capsule by mouth daily. Reported on 08/30/2015    [provider]  venlafaxine (EFFEXOR) 37.5 MG tablet Take 37.5 mg by mouth daily.     [provider]    Family History Family History  Problem Relation Age of Onset   Colon cancer Paternal Grandmother    Colon polyps Neg Hx     Social History Social History   Tobacco Use  Smoking status: Never   Smokeless tobacco: Never  Vaping Use   Vaping status: Never Used  Substance Use Topics   Alcohol use: Yes    Alcohol/week: 0.0 standard drinks of alcohol    Comment: very rare    Drug use: No     Allergies   Compazine, Indocin [indomethacin], Metoclopramide, Morphine and codeine, and Toradol [ketorolac tromethamine]   Review of Systems Review of Systems See HPI  Physical Exam Triage Vital Signs ED Triage Vitals  Encounter Vitals Group     BP 05/14/23 1517 (!) 153/81     Systolic BP Percentile --      Diastolic BP Percentile --      Pulse Rate 05/14/23 1517 71     Resp 05/14/23 1517 18     Temp 05/14/23 1517 98.2 F (36.8 C)     Temp Source 05/14/23 1517 Oral     SpO2 05/14/23 1517 98 %     Weight 05/14/23 1519 185 lb (83.9 kg)     Height 05/14/23 1519 5\' 4"  (1.626 m)      Head Circumference --      Peak Flow --      Pain Score 05/14/23 1519 8     Pain Loc --      Pain Education --      Exclude from Growth Chart --    No data found.  Updated Vital Signs BP (!) 153/81 (BP Location: Right Arm)   Pulse 71   Temp 98.2 F (36.8 C) (Oral)   Resp 18   Ht 5\' 4"  (1.626 m)   Wt 83.9 kg   SpO2 98%   BMI 31.76 kg/m       Physical Exam Constitutional:      General: She is not in acute distress.    Appearance: She is well-developed. She is ill-appearing.  HENT:     Head: Normocephalic and atraumatic.     Right Ear: Tympanic membrane and ear canal normal.     Left Ear: Tympanic membrane and ear canal normal.     Nose: Congestion and rhinorrhea present.     Mouth/Throat:     Mouth: Mucous membranes are moist.     Tonsils: 0 on the right. 0 on the left.  Eyes:     Conjunctiva/sclera: Conjunctivae normal.     Pupils: Pupils are equal, round, and reactive to light.  Cardiovascular:     Rate and Rhythm: Normal rate and regular rhythm.     Heart sounds: Normal heart sounds.  Pulmonary:     Effort: Pulmonary effort is normal. No respiratory distress.     Breath sounds: Normal breath sounds.  Abdominal:     General: There is no distension.     Palpations: Abdomen is soft.  Musculoskeletal:        General: Normal range of motion.     Cervical back: Normal range of motion.  Lymphadenopathy:     Cervical: No cervical adenopathy.  Skin:    General: Skin is warm and dry.  Neurological:     Mental Status: She is alert.      UC Treatments / Results  Labs (all labs ordered are listed, but only abnormal results are displayed) Labs Reviewed  POCT RAPID STREP A (OFFICE)   Rapid strep is negative.  COVID test at home is negative EKG   Radiology No results found.  Procedures Procedures (including critical care time)  Medications Ordered in UC Medications - No data to display  Initial Impression / Assessment and Plan / UC Course  I have  reviewed the triage vital signs and the nursing notes.  Pertinent labs & imaging results that were available during my care of the patient were reviewed by me and considered in my medical decision making (see chart for details).     Reviewed the patient likely has a viral upper respiratory infection.  Discussed medication, treatment, reasons for return Final Clinical Impressions(s) / UC Diagnoses   Final diagnoses:  Viral pharyngitis     Discharge Instructions      Drink lots of fluids May take over-the-counter cough or cold medicines as needed Consider Flonase Call here or see your doctor if not improving by next week   ED Prescriptions   None    I have reviewed the PDMP during this encounter.   Eustace Moore, MD 05/14/23 [id number]

## 2023-05-14 NOTE — Discharge Instructions (Signed)
Drink lots of fluids May take over-the-counter cough or cold medicines as needed Consider Flonase Call here or see your doctor if not improving by next week

## 2023-05-14 NOTE — ED Triage Notes (Signed)
Patient c/o sore throat, nasal congestion x 3 days.  Patient has taken at home COVID test was negative.  Patient has taken Motrin, Tylenol, Mucinex and Cepacol lozengers.

## 2023-05-19 ENCOUNTER — Other Ambulatory Visit (HOSPITAL_BASED_OUTPATIENT_CLINIC_OR_DEPARTMENT_OTHER): Payer: Self-pay

## 2023-05-19 DIAGNOSIS — J069 Acute upper respiratory infection, unspecified: Secondary | ICD-10-CM | POA: Diagnosis not present

## 2023-05-19 MED ORDER — HYDROCODONE BIT-HOMATROP MBR 5-1.5 MG/5ML PO SOLN
5.0000 mL | Freq: Four times a day (QID) | ORAL | 0 refills | Status: DC
  Filled 2023-05-19: qty 100, 5d supply, fill #0

## 2023-05-19 MED ORDER — AMOXICILLIN-POT CLAVULANATE 875-125 MG PO TABS
1.0000 | ORAL_TABLET | Freq: Two times a day (BID) | ORAL | 0 refills | Status: AC
  Filled 2023-05-19: qty 14, 7d supply, fill #0

## 2023-05-19 MED ORDER — AZELASTINE HCL 137 MCG/SPRAY NA SOLN
1.0000 | Freq: Two times a day (BID) | NASAL | 1 refills | Status: AC
  Filled 2023-05-19: qty 30, 30d supply, fill #0

## 2023-05-19 MED ORDER — ALBUTEROL SULFATE HFA 108 (90 BASE) MCG/ACT IN AERS
1.0000 | INHALATION_SPRAY | RESPIRATORY_TRACT | 1 refills | Status: AC | PRN
  Filled 2023-05-19: qty 6.7, 30d supply, fill #0

## 2023-05-28 ENCOUNTER — Other Ambulatory Visit (HOSPITAL_BASED_OUTPATIENT_CLINIC_OR_DEPARTMENT_OTHER): Payer: Self-pay

## 2023-05-28 DIAGNOSIS — J069 Acute upper respiratory infection, unspecified: Secondary | ICD-10-CM | POA: Diagnosis not present

## 2023-05-28 MED ORDER — FLUCONAZOLE 150 MG PO TABS
ORAL_TABLET | ORAL | 0 refills | Status: AC
  Filled 2023-05-28: qty 2, 3d supply, fill #0

## 2023-05-28 MED ORDER — DOXYCYCLINE HYCLATE 100 MG PO TABS
100.0000 mg | ORAL_TABLET | Freq: Two times a day (BID) | ORAL | 0 refills | Status: AC
  Filled 2023-05-28: qty 14, 7d supply, fill #0

## 2023-05-28 MED ORDER — PREDNISONE 10 MG PO TABS
ORAL_TABLET | ORAL | 0 refills | Status: AC
  Filled 2023-05-28: qty 18, 9d supply, fill #0

## 2023-05-28 MED ORDER — HYDROCODONE BIT-HOMATROP MBR 5-1.5 MG/5ML PO SOLN
5.0000 mL | Freq: Four times a day (QID) | ORAL | 0 refills | Status: AC
  Filled 2023-05-28: qty 100, 5d supply, fill #0

## 2023-06-13 ENCOUNTER — Other Ambulatory Visit: Payer: Self-pay

## 2023-06-13 ENCOUNTER — Other Ambulatory Visit (HOSPITAL_BASED_OUTPATIENT_CLINIC_OR_DEPARTMENT_OTHER): Payer: Self-pay

## 2023-06-20 ENCOUNTER — Other Ambulatory Visit (HOSPITAL_COMMUNITY): Payer: Self-pay

## 2023-07-29 ENCOUNTER — Other Ambulatory Visit (HOSPITAL_BASED_OUTPATIENT_CLINIC_OR_DEPARTMENT_OTHER): Payer: Self-pay

## 2023-07-29 DIAGNOSIS — Z13220 Encounter for screening for lipoid disorders: Secondary | ICD-10-CM | POA: Diagnosis not present

## 2023-07-29 DIAGNOSIS — E039 Hypothyroidism, unspecified: Secondary | ICD-10-CM | POA: Diagnosis not present

## 2023-07-29 DIAGNOSIS — M8580 Other specified disorders of bone density and structure, unspecified site: Secondary | ICD-10-CM | POA: Diagnosis not present

## 2023-07-29 DIAGNOSIS — E6609 Other obesity due to excess calories: Secondary | ICD-10-CM | POA: Diagnosis not present

## 2023-07-29 DIAGNOSIS — I10 Essential (primary) hypertension: Secondary | ICD-10-CM | POA: Diagnosis not present

## 2023-07-29 DIAGNOSIS — Z1211 Encounter for screening for malignant neoplasm of colon: Secondary | ICD-10-CM | POA: Diagnosis not present

## 2023-07-29 DIAGNOSIS — Z0000 Encounter for general adult medical examination without abnormal findings: Secondary | ICD-10-CM | POA: Diagnosis not present

## 2023-07-29 DIAGNOSIS — Z1231 Encounter for screening mammogram for malignant neoplasm of breast: Secondary | ICD-10-CM | POA: Diagnosis not present

## 2023-07-29 DIAGNOSIS — F339 Major depressive disorder, recurrent, unspecified: Secondary | ICD-10-CM | POA: Diagnosis not present

## 2023-07-29 DIAGNOSIS — Z7185 Encounter for immunization safety counseling: Secondary | ICD-10-CM | POA: Diagnosis not present

## 2023-07-29 MED ORDER — LISINOPRIL 10 MG PO TABS
10.0000 mg | ORAL_TABLET | Freq: Every day | ORAL | 3 refills | Status: AC
  Filled 2023-07-29 – 2023-12-26 (×2): qty 90, 90d supply, fill #0
  Filled 2024-03-24: qty 90, 90d supply, fill #1
  Filled 2024-06-29: qty 90, 90d supply, fill #2

## 2023-07-29 MED ORDER — VENLAFAXINE HCL ER 150 MG PO CP24
150.0000 mg | ORAL_CAPSULE | Freq: Every day | ORAL | 3 refills | Status: AC
  Filled 2023-07-29 – 2024-01-20 (×3): qty 90, 90d supply, fill #0
  Filled 2024-04-20: qty 90, 90d supply, fill #1
  Filled 2024-07-20: qty 90, 90d supply, fill #2

## 2023-08-14 DIAGNOSIS — Z13220 Encounter for screening for lipoid disorders: Secondary | ICD-10-CM | POA: Diagnosis not present

## 2023-08-14 DIAGNOSIS — E039 Hypothyroidism, unspecified: Secondary | ICD-10-CM | POA: Diagnosis not present

## 2023-08-14 DIAGNOSIS — I10 Essential (primary) hypertension: Secondary | ICD-10-CM | POA: Diagnosis not present

## 2023-08-18 DIAGNOSIS — R7309 Other abnormal glucose: Secondary | ICD-10-CM | POA: Diagnosis not present

## 2023-10-01 ENCOUNTER — Other Ambulatory Visit (HOSPITAL_BASED_OUTPATIENT_CLINIC_OR_DEPARTMENT_OTHER): Payer: Self-pay

## 2023-12-26 ENCOUNTER — Other Ambulatory Visit (HOSPITAL_BASED_OUTPATIENT_CLINIC_OR_DEPARTMENT_OTHER): Payer: Self-pay

## 2024-01-20 ENCOUNTER — Other Ambulatory Visit (HOSPITAL_BASED_OUTPATIENT_CLINIC_OR_DEPARTMENT_OTHER): Payer: Self-pay
